# Patient Record
Sex: Female | Born: 1995 | Race: White | Hispanic: No | Marital: Single | Smoking: Former smoker
Health system: Southern US, Community
[De-identification: ages and names within clinical notes are randomized; demographics above are authoritative.]

## PROBLEM LIST (undated history)

## (undated) DIAGNOSIS — N83519 Torsion of ovary and ovarian pedicle, unspecified side: Secondary | ICD-10-CM

---
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
## 2018-01-27 ENCOUNTER — Other Ambulatory Visit: Payer: Self-pay

## 2018-01-27 ENCOUNTER — Encounter (HOSPITAL_BASED_OUTPATIENT_CLINIC_OR_DEPARTMENT_OTHER): Payer: Self-pay | Admitting: *Deleted

## 2018-01-27 ENCOUNTER — Emergency Department (HOSPITAL_BASED_OUTPATIENT_CLINIC_OR_DEPARTMENT_OTHER)
Admission: EM | Admit: 2018-01-27 | Discharge: 2018-01-27 | Disposition: A | Discharge status: Home / Self Care | Payer: Commercial Managed Care - PPO | Attending: Emergency Medicine | Admitting: Emergency Medicine

## 2018-01-27 DIAGNOSIS — M25512 Pain in left shoulder: Secondary | ICD-10-CM | POA: Diagnosis present

## 2018-01-27 DIAGNOSIS — F17210 Nicotine dependence, cigarettes, uncomplicated: Secondary | ICD-10-CM | POA: Insufficient documentation

## 2018-01-27 DIAGNOSIS — Z79899 Other long term (current) drug therapy: Secondary | ICD-10-CM | POA: Diagnosis not present

## 2018-01-27 DIAGNOSIS — J45909 Unspecified asthma, uncomplicated: Secondary | ICD-10-CM | POA: Insufficient documentation

## 2018-01-27 DIAGNOSIS — M62838 Other muscle spasm: Secondary | ICD-10-CM | POA: Insufficient documentation

## 2018-01-27 MED ORDER — IBUPROFEN 800 MG PO TABS: 800.0000 mg | ORAL_TABLET | Freq: Three times a day (TID) | ORAL | 0 refills | Status: AC

## 2018-01-27 MED ORDER — DIAZEPAM 5 MG PO TABS
5.0000 mg | ORAL_TABLET | Freq: Once | ORAL | Status: AC
  Administered 2018-01-27: 5 mg via ORAL
  Filled 2018-01-27: qty 1

## 2018-01-27 MED ORDER — IBUPROFEN 800 MG PO TABS
800.0000 mg | ORAL_TABLET | Freq: Once | ORAL | Status: AC
  Administered 2018-01-27: 800 mg via ORAL
  Filled 2018-01-27: qty 1

## 2018-01-27 NOTE — ED Provider Notes (Signed)
[facility] EMERGENCY DEPARTMENT Provider Note   CSN: [id number] Arrival date & time: 01/27/18  1835     History   Chief Complaint Chief Complaint  Patient presents with  . Shoulder Pain    HPI Annette Middleton is a 22 y.o. female.  The history is provided by the patient.  Illness  This is a new problem. The current episode started more than 2 days ago. The problem occurs daily. The problem has not changed since onset.Pertinent negatives include no chest pain, no abdominal pain, no headaches and no shortness of breath. Associated symptoms comments: Left shoulder pain/left trapezius pain . The symptoms are aggravated by bending (movement). The symptoms are relieved by medications. She has tried a warm compress for the symptoms. The treatment provided mild relief.    Past Medical History:  Diagnosis Date  . Asthma   . Ovarian torsion     Patient Active Problem List   Diagnosis Date Noted  . Right knee pain 06/26/2011    History reviewed. No pertinent surgical history.   OB History   None      Home Medications    Prior to Admission medications   Medication Sig Start Date End Date Taking? Authorizing Provider  levonorgestrel-ethinyl estradiol (AVIANE,ALESSE,LESSINA) 0.1-20 MG-MCG tablet Take 1 tablet by mouth daily.   Yes [provider]  acetaminophen-codeine 120-12 MG/5ML solution Take 10 mLs by mouth every 4 (four) hours as needed for moderate pain. 11/06/15   Lawyer, Cristal Deer, PA-C  albuterol (PROVENTIL) (2.5 MG/3ML) 0.083% nebulizer solution Take 2.5 mg by nebulization every 6 (six) hours as needed. Shortness of breath    [provider]  famotidine (PEPCID) 20 MG tablet Take 1 tablet (20 mg total) by mouth 2 (two) times daily. 03/30/15   Palumbo, April, MD  ibuprofen (ADVIL,MOTRIN) 800 MG tablet Take 1 tablet (800 mg total) by mouth 3 (three) times daily for 30 doses. 01/27/18 02/06/18  Shaneeka Scarboro, DO  nitrofurantoin,  macrocrystal-monohydrate, (MACROBID) 100 MG capsule Take 1 capsule (100 mg total) by mouth 2 (two) times daily. 06/25/13   Teressa Lower, NP  triamcinolone cream (KENALOG) 0.1 % Apply 1 application topically 2 (two) times daily. 11/16/13   Elson Areas, PA-C    Family History Family History  Problem Relation Age of Onset  . Hyperlipidemia Mother   . Hypertension Mother   . Sudden death Neg Hx   . Heart attack Neg Hx   . Diabetes Neg Hx     Social History Social History   Tobacco Use  . Smoking status: Current Every Day Smoker    Packs/day: 0.50  . Smokeless tobacco: Never Used  Substance Use Topics  . Alcohol use: No  . Drug use: Yes    Types: Marijuana    Comment: socially     Allergies   Fish-derived products; Red dye; and Sulfa antibiotics   Review of Systems Review of Systems  Constitutional: Negative for chills and fever.  HENT: Negative for ear pain and sore throat.   Eyes: Negative for pain and visual disturbance.  Respiratory: Negative for cough and shortness of breath.   Cardiovascular: Negative for chest pain and palpitations.  Gastrointestinal: Negative for abdominal pain and vomiting.  Genitourinary: Negative for dysuria and hematuria.  Musculoskeletal: Positive for arthralgias and myalgias. Negative for back pain, gait problem, joint swelling, neck pain and neck stiffness.  Skin: Negative for color change and rash.  Neurological: Negative for dizziness, tremors, seizures, syncope, facial asymmetry, speech difficulty, weakness,  light-headedness, numbness and headaches.  All other systems reviewed and are negative.    Physical Exam Updated Vital Signs BP 137/86 (BP Location: Right Arm)   Pulse 95   Temp 98.8 F (37.1 C) (Oral)   Resp 18   Ht 5' 2.5" (1.588 m)   Wt 64.4 kg   LMP 01/06/2018   SpO2 100%   BMI 25.56 kg/m   Physical Exam  Constitutional: She is oriented to person, place, and time. She appears well-developed and well-nourished.  No distress.  HENT:  Head: Normocephalic and atraumatic.  Eyes: Pupils are equal, round, and reactive to light. Conjunctivae and EOM are normal.  Neck: Normal range of motion. Neck supple.  Cardiovascular: Normal rate, regular rhythm, normal heart sounds and intact distal pulses.  No murmur heard. Pulmonary/Chest: Effort normal and breath sounds normal. No respiratory distress.  Abdominal: Soft. There is no tenderness.  Musculoskeletal: Normal range of motion. She exhibits tenderness (TTP to left trapezius/lats). She exhibits no edema or deformity.  No midline spinal pain   Neurological: She is alert and oriented to person, place, and time. No cranial nerve deficit or sensory deficit. She exhibits normal muscle tone. Coordination normal.  5+/5 strength in UEs, normal sensation in UE  Skin: Skin is warm and dry.  Psychiatric: She has a normal mood and affect.  Nursing note and vitals reviewed.    ED Treatments / Results  Labs (all labs ordered are listed, but only abnormal results are displayed) Labs Reviewed - No data to display  EKG None  Radiology No results found.  Procedures Procedures (including critical care time)  Medications Ordered in ED Medications  ibuprofen (ADVIL,MOTRIN) tablet 800 mg (800 mg Oral Given 01/27/18 1949)  diazepam (VALIUM) tablet 5 mg (5 mg Oral Given 01/27/18 1949)     Initial Impression / Assessment and Plan / ED Course  I have reviewed the triage vital signs and the nursing notes.  Pertinent labs & imaging results that were available during my care of the patient were reviewed by me and considered in my medical decision making (see chart for details).     Annette Middleton is a 22 year old female with no significant medical history who presents to the ED with left shoulder pain.  Patient with normal vitals.  No fever.  Patient works at the manual labor job.  States that she has had pain in the left shoulder and upper back for the last several  days to weeks.  Pain is worse with lifting heavy boxes at work.  She has used nightly muscle relaxant without much relief.  She has not tried any anti-inflammatories.  She denies any specific trauma.  Patient is tender in the left trapezius and left latissimus area on exam.  Consistent with a muscle spasm.  Patient is neurologically intact.  No concern for spinal process/radiculopathy or rotator tear.  Patient given Valium and Motrin while in the ED.  Recommend continued use of Motrin and muscle relaxant at home.  Written for light duty.  Given return precautions and discharged from ED in good condition.  This chart was dictated using voice recognition software.  Despite best efforts to proofread,  errors can occur which can change the documentation meaning.   Final Clinical Impressions(s) / ED Diagnoses   Final diagnoses:  Muscle spasm    ED Discharge Orders         Ordered    ibuprofen (ADVIL,MOTRIN) 800 MG tablet  3 times daily     01/27/18  [address]           [place], [place] 01/27/18 2157

## 2018-01-27 NOTE — ED Triage Notes (Signed)
Left shoulder pain for a week. No known injury. Limited ROM.

## 2018-03-10 ENCOUNTER — Emergency Department (HOSPITAL_BASED_OUTPATIENT_CLINIC_OR_DEPARTMENT_OTHER)
Admission: EM | Admit: 2018-03-10 | Discharge: 2018-03-11 | Disposition: A | Discharge status: Home / Self Care | Payer: Commercial Managed Care - PPO | Attending: Emergency Medicine | Admitting: Emergency Medicine

## 2018-03-10 ENCOUNTER — Emergency Department (HOSPITAL_BASED_OUTPATIENT_CLINIC_OR_DEPARTMENT_OTHER): Payer: Commercial Managed Care - PPO

## 2018-03-10 ENCOUNTER — Encounter (HOSPITAL_BASED_OUTPATIENT_CLINIC_OR_DEPARTMENT_OTHER): Payer: Self-pay | Admitting: *Deleted

## 2018-03-10 ENCOUNTER — Other Ambulatory Visit: Payer: Self-pay

## 2018-03-10 DIAGNOSIS — M25511 Pain in right shoulder: Secondary | ICD-10-CM | POA: Diagnosis present

## 2018-03-10 DIAGNOSIS — F17200 Nicotine dependence, unspecified, uncomplicated: Secondary | ICD-10-CM | POA: Insufficient documentation

## 2018-03-10 MED ORDER — IBUPROFEN 400 MG PO TABS
400.0000 mg | ORAL_TABLET | Freq: Once | ORAL | Status: AC | PRN
  Administered 2018-03-10: 400 mg via ORAL
  Filled 2018-03-10: qty 1

## 2018-03-10 NOTE — ED Triage Notes (Signed)
Right shoulder pain x 2 days. She hit her shoulder on a wall.

## 2018-03-11 MED ORDER — NAPROXEN 500 MG PO TABS: 500.0000 mg | ORAL_TABLET | Freq: Two times a day (BID) | ORAL | 0 refills | Status: DC

## 2018-03-11 MED ORDER — TRAMADOL HCL 50 MG PO TABS: 50.0000 mg | ORAL_TABLET | Freq: Four times a day (QID) | ORAL | 0 refills | Status: AC | PRN

## 2018-03-11 NOTE — ED Provider Notes (Signed)
[facility] EMERGENCY DEPARTMENT Provider Note   CSN: [id number] Arrival date & time: 03/10/18  1843     History   Chief Complaint Chief Complaint  Patient presents with  . Shoulder Pain    HPI Annette Middleton is a 22 y.o. female.  Patient is a 22 year old female with no significant past medical history.  She presents with complaints of right shoulder pain.  This began yesterday in the absence of any injury or trauma.  She describes pain in the back of her shoulder that radiates into the lateral aspect of her shoulder.  It is worse with certain positions and movements.  She denies any numbness, tingling, or weakness.  The history is provided by the patient.  Shoulder Pain   This is a new problem. The current episode started yesterday. The problem occurs constantly. The problem has not changed since onset.The pain is severe. Pertinent negatives include no numbness and full range of motion. The symptoms are aggravated by activity. She has tried nothing for the symptoms.    Past Medical History:  Diagnosis Date  . Asthma   . Ovarian torsion     Patient Active Problem List   Diagnosis Date Noted  . Right knee pain 06/26/2011    History reviewed. No pertinent surgical history.   OB History   No obstetric history on file.      Home Medications    Prior to Admission medications   Medication Sig Start Date End Date Taking? Authorizing Provider  levonorgestrel-ethinyl estradiol (AVIANE,ALESSE,LESSINA) 0.1-20 MG-MCG tablet Take 1 tablet by mouth daily.   Yes [provider]  acetaminophen-codeine 120-12 MG/5ML solution Take 10 mLs by mouth every 4 (four) hours as needed for moderate pain. 11/06/15   Lawyer, Cristal Deerhristopher, PA-C  albuterol (PROVENTIL) (2.5 MG/3ML) 0.083% nebulizer solution Take 2.5 mg by nebulization every 6 (six) hours as needed. Shortness of breath    [provider]  famotidine (PEPCID) 20 MG tablet Take 1 tablet (20 mg total) by  mouth 2 (two) times daily. 03/30/15   Palumbo, April, MD  nitrofurantoin, macrocrystal-monohydrate, (MACROBID) 100 MG capsule Take 1 capsule (100 mg total) by mouth 2 (two) times daily. 06/25/13   Teressa LowerPickering, Vrinda, NP  triamcinolone cream (KENALOG) 0.1 % Apply 1 application topically 2 (two) times daily. 11/16/13   Elson AreasSofia, Leslie K, PA-C    Family History Family History  Problem Relation Age of Onset  . Hyperlipidemia Mother   . Hypertension Mother   . Sudden death Neg Hx   . Heart attack Neg Hx   . Diabetes Neg Hx     Social History Social History   Tobacco Use  . Smoking status: Current Every Day Smoker    Packs/day: 0.50  . Smokeless tobacco: Never Used  Substance Use Topics  . Alcohol use: No  . Drug use: Yes    Types: Marijuana    Comment: socially     Allergies   Fish-derived products; Red dye; and Sulfa antibiotics   Review of Systems Review of Systems  Neurological: Negative for numbness.  All other systems reviewed and are negative.    Physical Exam Updated Vital Signs BP 125/78 (BP Location: Left Arm)   Pulse 74   Temp 98.8 F (37.1 C) (Oral)   Resp 18   Ht 5' 2.5" (1.588 m)   Wt 66.2 kg   LMP 01/08/2018 Comment: states she has abnormal periods  SpO2 100%   BMI 26.28 kg/m   Physical Exam Vitals signs and  nursing note reviewed.  Constitutional:      Appearance: Normal appearance.  HENT:     Head: Normocephalic and atraumatic.  Pulmonary:     Effort: Pulmonary effort is normal.  Musculoskeletal:     Comments: The right shoulder appears grossly normal.  There is no swelling or deformity.  Ulnar and radial pulses are easily palpable and motor and sensation are intact throughout the entire hand.  She does have pain with range of motion.  Skin:    General: Skin is warm.  Neurological:     Mental Status: She is alert.      ED Treatments / Results  Labs (all labs ordered are listed, but only abnormal results are displayed) Labs Reviewed - No  data to display  EKG None  Radiology Dg Shoulder Right  Result Date: 03/10/2018 CLINICAL DATA:  Ran into wall, anterior pain EXAM: RIGHT SHOULDER - 2+ VIEW COMPARISON:  None. FINDINGS: Superior elevation of the distal end of the right clavicle with respect to acromion without joint widening. No fracture or malalignment at the glenohumeral interval. The right lung apex is clear. IMPRESSION: Superior migration of the distal end of the right clavicle with respect to the acromion, suspicious for [facility] joint injury. Electronically Signed   By: Jasmine PangKim  Fujinaga M.D.   On: 03/10/2018 19:31    Procedures Procedures (including critical care time)  Medications Ordered in ED Medications  ibuprofen (ADVIL,MOTRIN) tablet 400 mg (400 mg Oral Given 03/10/18 2252)     Initial Impression / Assessment and Plan / ED Course  I have reviewed the triage vital signs and the nursing notes.  Pertinent labs & imaging results that were available during my care of the patient were reviewed by me and considered in my medical decision making (see chart for details).  Patient with musculoskeletal right shoulder pain, possibly radicular in nature.  She will be placed in an arm sling, treated with anti-inflammatories and tramadol, and is to follow-up as needed if not improving.  Final Clinical Impressions(s) / ED Diagnoses   Final diagnoses:  None    ED Discharge Orders    None       Geoffery Lyonselo, Gennell How, MD 03/11/18 [phone]

## 2018-03-11 NOTE — Discharge Instructions (Addendum)
Naproxen as prescribed.  Tramadol as prescribed as needed for pain not relieved with naproxen.  Wear arm sling for the next several days, then slowly reintroduce activity as tolerated.  Follow-up with your primary doctor if your symptoms or not improving in the next 2 weeks.

## 2018-05-15 ENCOUNTER — Encounter (HOSPITAL_BASED_OUTPATIENT_CLINIC_OR_DEPARTMENT_OTHER): Payer: Self-pay | Admitting: Emergency Medicine

## 2018-05-15 ENCOUNTER — Other Ambulatory Visit: Payer: Self-pay

## 2018-05-15 ENCOUNTER — Emergency Department (HOSPITAL_BASED_OUTPATIENT_CLINIC_OR_DEPARTMENT_OTHER)
Admission: EM | Admit: 2018-05-15 | Discharge: 2018-05-15 | Disposition: A | Discharge status: Home / Self Care | Payer: Commercial Managed Care - PPO | Attending: Emergency Medicine | Admitting: Emergency Medicine

## 2018-05-15 DIAGNOSIS — F17200 Nicotine dependence, unspecified, uncomplicated: Secondary | ICD-10-CM | POA: Insufficient documentation

## 2018-05-15 DIAGNOSIS — J45909 Unspecified asthma, uncomplicated: Secondary | ICD-10-CM | POA: Insufficient documentation

## 2018-05-15 DIAGNOSIS — Z79899 Other long term (current) drug therapy: Secondary | ICD-10-CM | POA: Insufficient documentation

## 2018-05-15 DIAGNOSIS — R599 Enlarged lymph nodes, unspecified: Secondary | ICD-10-CM | POA: Insufficient documentation

## 2018-05-15 NOTE — ED Triage Notes (Signed)
Pt having a hard knot in between her left side lower abd and groin that is painful to touch.

## 2018-05-15 NOTE — ED Provider Notes (Signed)
[facility] EMERGENCY DEPARTMENT Provider Note   CSN: [id number] Arrival date & time: 05/15/18  1929    History   Chief Complaint Chief Complaint  Patient presents with  . Cyst    HPI Annette Middleton is a 23 y.o. female presenting to the emergency department with complaint of a "knot" in her left groin since Tuesday of last week.  She states symptoms come and go, however worsened yesterday evening her dog stepped on that area.  States pain is stabbing, worse with walking, moving her left leg, prolonged standing.  Has had associated low back ache.  Of note, she is currently being treated with metronidazole for bacterial vaginosis.  No interventions tried prior to arrival.  Denies associated diarrhea, constipation, urinary symptoms, fever.  LMP 04/28/2018.  No history of abdominal surgeries.  No recent heavy lifting.     The history is provided by the patient.    Past Medical History:  Diagnosis Date  . Asthma   . Ovarian torsion     Patient Active Problem List   Diagnosis Date Noted  . Right knee pain 06/26/2011    History reviewed. No pertinent surgical history.   OB History   No obstetric history on file.      Home Medications    Prior to Admission medications   Medication Sig Start Date End Date Taking? Authorizing Provider  acetaminophen-codeine 120-12 MG/5ML solution Take 10 mLs by mouth every 4 (four) hours as needed for moderate pain. 11/06/15   Lawyer, Cristal Deer, PA-C  albuterol (PROVENTIL) (2.5 MG/3ML) 0.083% nebulizer solution Take 2.5 mg by nebulization every 6 (six) hours as needed. Shortness of breath    [provider]  famotidine (PEPCID) 20 MG tablet Take 1 tablet (20 mg total) by mouth 2 (two) times daily. 03/30/15   Palumbo, April, MD  levonorgestrel-ethinyl estradiol (AVIANE,ALESSE,LESSINA) 0.1-20 MG-MCG tablet Take 1 tablet by mouth daily.    [provider]  naproxen (NAPROSYN) 500 MG tablet Take 1 tablet (500 mg total)  by mouth 2 (two) times daily. 03/11/18   Geoffery Lyons, MD  nitrofurantoin, macrocrystal-monohydrate, (MACROBID) 100 MG capsule Take 1 capsule (100 mg total) by mouth 2 (two) times daily. 06/25/13   Teressa Lower, NP  traMADol (ULTRAM) 50 MG tablet Take 1 tablet (50 mg total) by mouth every 6 (six) hours as needed. 03/11/18   Geoffery Lyons, MD  triamcinolone cream (KENALOG) 0.1 % Apply 1 application topically 2 (two) times daily. 11/16/13   Elson Areas, PA-C    Family History Family History  Problem Relation Age of Onset  . Hyperlipidemia Mother   . Hypertension Mother   . Sudden death Neg Hx   . Heart attack Neg Hx   . Diabetes Neg Hx     Social History Social History   Tobacco Use  . Smoking status: Current Every Day Smoker    Packs/day: 0.50  . Smokeless tobacco: Never Used  Substance Use Topics  . Alcohol use: No  . Drug use: Yes    Types: Marijuana    Comment: socially     Allergies   Fish-derived products; Red dye; and Sulfa antibiotics   Review of Systems Review of Systems  Constitutional: Negative for fever.  Gastrointestinal: Negative for abdominal pain, constipation, diarrhea, nausea and vomiting.       Groin pain  Genitourinary: Negative for dysuria and frequency.  Musculoskeletal: Positive for back pain.  All other systems reviewed and are negative.    Physical Exam Updated  Vital Signs BP 124/76 (BP Location: Left Arm)   Pulse 97   Temp 98.9 F (37.2 C) (Oral)   Resp 16   Ht 5' 2.5" (1.588 m)   Wt 63.5 kg   LMP 04/28/2018   SpO2 100%   BMI 25.20 kg/m   Physical Exam Vitals signs and nursing note reviewed.  Constitutional:      Appearance: She is well-developed.  HENT:     Head: Normocephalic and atraumatic.  Eyes:     Conjunctiva/sclera: Conjunctivae normal.  Cardiovascular:     Rate and Rhythm: Normal rate and regular rhythm.  Pulmonary:     Effort: Pulmonary effort is normal.     Breath sounds: Normal breath sounds.    Abdominal:     General: Bowel sounds are normal. There is no distension.     Palpations: Abdomen is soft.     Tenderness: There is no guarding or rebound.       Comments: There is a grape-sized tender firm mobile mass to left inguinal region. No fluctuance. No skin changes  Skin:    General: Skin is warm.  Neurological:     Mental Status: She is alert.  Psychiatric:        Behavior: Behavior normal.      ED Treatments / Results  Labs (all labs ordered are listed, but only abnormal results are displayed) Labs Reviewed - No data to display  EKG None  Radiology No results found.  Procedures Procedures (including critical care time)  Medications Ordered in ED Medications - No data to display   Initial Impression / Assessment and Plan / ED Course  I have reviewed the triage vital signs and the nursing notes.  Pertinent labs & imaging results that were available during my care of the patient were reviewed by me and considered in my medical decision making (see chart for details).        Pt with recent diagnosis of BV, currently undergoing treatment, presenting with some firm mobile mass to left inguinal region. Symptoms began in relation to the BV symptoms. Afebrile, normal VS. Abdomen is soft and nontender. Bedside ultrasound without evidence of fluid collection. Mass appears to be consistent with lymph node on color. Pt discussed with and evaluated by Dr. Lockie Mola. Felt to be likely enlarged lymph node, less likely hernia. No other abdominal symptoms. Will discharge with symptomatic management, outpatient follow up and strict return precautions. Pt agreeable to plan and safe for discharge.  Discussed results, findings, treatment and follow up. Patient advised of return precautions. Patient verbalized understanding and agreed with plan.  Final Clinical Impressions(s) / ED Diagnoses   Final diagnoses:  Enlarged lymph node    ED Discharge Orders    None        , [place] N, PA-C 05/15/18 2057    Virgina Norfolk, DO 05/15/18 2125

## 2018-05-15 NOTE — Discharge Instructions (Addendum)
Treat your symptoms with ibuprofen and tylenol as needed for pain. Complete your antibiotics as prescribed.  Follow-up with your gynecologist regarding your visit today.  If you develop fever, nausea/vomiting, constipation, or new or concerning symptoms, you can report to the emergency department.

## 2018-05-15 NOTE — ED Provider Notes (Signed)
Medical screening examination/treatment/procedure(s) were conducted as a shared visit with non-physician practitioner(s) and myself.  I personally evaluated the patient during the encounter. Briefly, the patient is a 23 y.o. female with history of asthma who presents to the ED with left inguinal pain.  Patient with normal vitals.  No fever.  Pain for the last 2 days.  Patient recently diagnosed with bacterial vaginosis and has been on Flagyl.  Denies any further vaginal discharge, vaginal bleeding.  Is on birth control.  No concern for pregnancy.  Patient denies any abdominal pain, no nausea, no vomiting.  Patient has normal bowel movements.  Patient has tenderness that cystic-like structure along the left inguinal ligament that is likely consistent with lymphadenopathy.  Likely from bacterial vaginosis. No concern for STD. Does not appear consistent with hernia.  She has no abdominal tenderness on exam.No consistent with torsion.  Overall this is likely a reactive lymph node from bacterial vaginosis.  She has been on Flagyl for the last 2 days.  Recommend that she finish off course of Flagyl.  Given strict return precautions if she develops any fever, nausea, vomiting, issues with going to the bathroom she should come back for evaluation.  Recommend Tylenol Motrin for pain.  Discharged from ED in good condition.  This chart was dictated using voice recognition software.  Despite best efforts to proofread,  errors can occur which can change the documentation meaning.     EKG Interpretation None           Virgina Norfolk, DO 05/15/18 2050

## 2018-05-17 ENCOUNTER — Other Ambulatory Visit: Payer: Self-pay

## 2018-05-17 ENCOUNTER — Encounter (HOSPITAL_BASED_OUTPATIENT_CLINIC_OR_DEPARTMENT_OTHER): Payer: Self-pay | Admitting: Emergency Medicine

## 2018-05-17 ENCOUNTER — Emergency Department (HOSPITAL_BASED_OUTPATIENT_CLINIC_OR_DEPARTMENT_OTHER)
Admission: EM | Admit: 2018-05-17 | Discharge: 2018-05-17 | Disposition: A | Discharge status: Home / Self Care | Payer: Medicaid Other | Attending: Emergency Medicine | Admitting: Emergency Medicine

## 2018-05-17 DIAGNOSIS — B3731 Acute candidiasis of vulva and vagina: Secondary | ICD-10-CM

## 2018-05-17 DIAGNOSIS — Z79899 Other long term (current) drug therapy: Secondary | ICD-10-CM | POA: Insufficient documentation

## 2018-05-17 DIAGNOSIS — J45909 Unspecified asthma, uncomplicated: Secondary | ICD-10-CM | POA: Insufficient documentation

## 2018-05-17 DIAGNOSIS — F17200 Nicotine dependence, unspecified, uncomplicated: Secondary | ICD-10-CM | POA: Insufficient documentation

## 2018-05-17 DIAGNOSIS — B373 Candidiasis of vulva and vagina: Secondary | ICD-10-CM | POA: Insufficient documentation

## 2018-05-17 DIAGNOSIS — N3000 Acute cystitis without hematuria: Secondary | ICD-10-CM | POA: Insufficient documentation

## 2018-05-17 LAB — URINALYSIS, ROUTINE W REFLEX MICROSCOPIC
Bilirubin Urine: NEGATIVE
GLUCOSE, UA: NEGATIVE mg/dL
Ketones, ur: NEGATIVE mg/dL
Nitrite: NEGATIVE
PROTEIN: NEGATIVE mg/dL
Specific Gravity, Urine: 1.025 (ref 1.005–1.030)
pH: 5.5 (ref 5.0–8.0)

## 2018-05-17 LAB — WET PREP, GENITAL
Clue Cells Wet Prep HPF POC: NONE SEEN
SPERM: NONE SEEN
Trich, Wet Prep: NONE SEEN
Yeast Wet Prep HPF POC: NONE SEEN

## 2018-05-17 LAB — URINALYSIS, MICROSCOPIC (REFLEX)

## 2018-05-17 LAB — PREGNANCY, URINE: Preg Test, Ur: NEGATIVE

## 2018-05-17 MED ORDER — CEPHALEXIN 250 MG PO CAPS
500.0000 mg | ORAL_CAPSULE | Freq: Once | ORAL | Status: AC
  Administered 2018-05-17: 500 mg via ORAL
  Filled 2018-05-17: qty 2

## 2018-05-17 MED ORDER — NYSTATIN-TRIAMCINOLONE 100000-0.1 UNIT/GM-% EX CREA: TOPICAL_CREAM | CUTANEOUS | 0 refills | Status: DC

## 2018-05-17 MED ORDER — PROBIOTIC PO CAPS: 1.0000 | ORAL_CAPSULE | Freq: Every day | ORAL | 0 refills | Status: AC

## 2018-05-17 MED ORDER — FLUCONAZOLE 150 MG PO TABS: 150.0000 mg | ORAL_TABLET | Freq: Once | ORAL | 0 refills | Status: AC

## 2018-05-17 MED ORDER — CEPHALEXIN 500 MG PO CAPS: 500.0000 mg | ORAL_CAPSULE | Freq: Three times a day (TID) | ORAL | 0 refills | Status: DC

## 2018-05-17 MED ORDER — LIDOCAINE 5 % EX OINT: 1.0000 "application " | TOPICAL_OINTMENT | CUTANEOUS | 0 refills | Status: DC | PRN

## 2018-05-17 MED ORDER — LIDOCAINE 4 % EX CREA
TOPICAL_CREAM | Freq: Once | CUTANEOUS | Status: AC
  Administered 2018-05-17: 1 via TOPICAL
  Filled 2018-05-17: qty 5

## 2018-05-17 MED ORDER — FLUCONAZOLE 50 MG PO TABS
150.0000 mg | ORAL_TABLET | Freq: Once | ORAL | Status: AC
  Administered 2018-05-17: 150 mg via ORAL
  Filled 2018-05-17: qty 1

## 2018-05-17 MED ORDER — ACETAMINOPHEN 500 MG PO TABS
1000.0000 mg | ORAL_TABLET | Freq: Once | ORAL | Status: AC
  Administered 2018-05-17: 1000 mg via ORAL
  Filled 2018-05-17: qty 2

## 2018-05-17 NOTE — ED Provider Notes (Signed)
TIME SEEN: 2:10 AM  CHIEF COMPLAINT: Vaginal irritation  HPI: Patient is a 23 year old female with history of asthma, ovarian torsion who presents to the emergency department with vaginal irritation for the past day.  Was seen here 2 days ago for inguinal lymphadenopathy on the left side.  States afterwards she started developing redness and swelling to her labia with itching.  She states she has been trying not to scratch but has been so uncomfortable and itching in this area.  She states she has had a thick white discharge.  Is currently on metronidazole for bacterial vaginosis.  Has previously had chlamydia.  Last menstrual cycle was February 17.  No history of previous pregnancy.  States she has had some nausea secondary to the pain.  No vomiting or diarrhea.  No abdominal pain.  No fever.  States it is painful when the urine touches the outside of her labia but she has not having dysuria.  Mother has been using topical nystatin powder and cream without relief.  ROS: See HPI Constitutional: no fever  Eyes: no drainage  ENT: no runny nose   Cardiovascular:  no chest pain  Resp: no SOB  GI: no vomiting GU: no dysuria Integumentary: no rash  Allergy: no hives  Musculoskeletal: no leg swelling  Neurological: no slurred speech ROS otherwise negative  PAST MEDICAL HISTORY/PAST SURGICAL HISTORY:  Past Medical History:  Diagnosis Date  . Asthma   . Ovarian torsion     MEDICATIONS:  Prior to Admission medications   Medication Sig Start Date End Date Taking? Authorizing Provider  acetaminophen-codeine 120-12 MG/5ML solution Take 10 mLs by mouth every 4 (four) hours as needed for moderate pain. 11/06/15   Lawyer, Cristal Deer, PA-C  albuterol (PROVENTIL) (2.5 MG/3ML) 0.083% nebulizer solution Take 2.5 mg by nebulization every 6 (six) hours as needed. Shortness of breath    [provider]  famotidine (PEPCID) 20 MG tablet Take 1 tablet (20 mg total) by mouth 2 (two) times daily.  03/30/15   Palumbo, April, MD  levonorgestrel-ethinyl estradiol (AVIANE,ALESSE,LESSINA) 0.1-20 MG-MCG tablet Take 1 tablet by mouth daily.    [provider]  naproxen (NAPROSYN) 500 MG tablet Take 1 tablet (500 mg total) by mouth 2 (two) times daily. 03/11/18   Geoffery Lyons, MD  nitrofurantoin, macrocrystal-monohydrate, (MACROBID) 100 MG capsule Take 1 capsule (100 mg total) by mouth 2 (two) times daily. 06/25/13   Teressa Lower, NP  traMADol (ULTRAM) 50 MG tablet Take 1 tablet (50 mg total) by mouth every 6 (six) hours as needed. 03/11/18   Geoffery Lyons, MD  triamcinolone cream (KENALOG) 0.1 % Apply 1 application topically 2 (two) times daily. 11/16/13   Elson Areas, PA-C    ALLERGIES:  Allergies  Allergen Reactions  . Fish-Derived Products Swelling  . Red Dye Hives  . Sulfa Antibiotics     SOCIAL HISTORY:  Social History   Tobacco Use  . Smoking status: Current Every Day Smoker    Packs/day: 0.50  . Smokeless tobacco: Never Used  Substance Use Topics  . Alcohol use: No    FAMILY HISTORY: Family History  Problem Relation Age of Onset  . Hyperlipidemia Mother   . Hypertension Mother   . Sudden death Neg Hx   . Heart attack Neg Hx   . Diabetes Neg Hx     EXAM: BP 117/79 (BP Location: Left Arm)   Pulse (!) 104   Temp 97.7 F (36.5 C) (Oral)   Resp 16   Ht  5' 2.5" (1.588 m)   Wt 63.5 kg   LMP 04/28/2018   SpO2 100%   BMI 25.20 kg/m  CONSTITUTIONAL: Alert and oriented and responds appropriately to questions. Well-appearing; well-nourished HEAD: Normocephalic EYES: Conjunctivae clear, pupils appear equal, EOMI ENT: normal nose; moist mucous membranes NECK: Supple, no meningismus, no nuchal rigidity, no LAD  CARD: RRR; S1 and S2 appreciated; no murmurs, no clicks, no rubs, no gallops RESP: Normal chest excursion without splinting or tachypnea; breath sounds clear and equal bilaterally; no wheezes, no rhonchi, no rales, no hypoxia or respiratory  distress, speaking full sentences ABD/GI: Normal bowel sounds; non-distended; soft, non-tender, no rebound, no guarding, no peritoneal signs, no hepatosplenomegaly GU: Patient's external labia majora are inflamed, red with excoriations.  She has thick white appearing substance on her labia which may be nystatin.  No vaginal discharge.  No adnexal tenderness, mass or fullness, no cervical motion tenderness. Cervix is not appear friable.  Cervix is closed.  Chaperone present for exam. BACK:  The back appears normal and is non-tender to palpation, there is no CVA tenderness EXT: Normal ROM in all joints; non-tender to palpation; no edema; normal capillary refill; no cyanosis, no calf tenderness or swelling    SKIN: Normal color for age and race; warm; no rash NEURO: Moves all extremities equally PSYCH: The patient's mood and manner are appropriate. Grooming and personal hygiene are appropriate.  MEDICAL DECISION MAKING: Patient here with vaginitis.  I suspect that this is from yeast.  Her labia are inflamed, red and excoriated but no sign of superimposed bacterial infection.  No vesicular lesions.  She has no significant vaginal discharge on speculum exam and her wet prep shows many white blood cells but otherwise unremarkable.  Her pregnancy test is negative.  Bimanual exam unremarkable with no cervical motion tenderness, adnexal tenderness or mass.  Doubt TOA, torsion, PID.  Recommended they continue alternating Tylenol and Motrin for pain.  Will discharge with Lidoderm cream so that she can place it on her labia to help with discomfort.  Recommend sitz bath's.  Recommend she stop shaving this area.  Given Diflucan here in the emergency department and will discharge with prescription for the same to take in 3 days if symptoms not improving.  Urine appears infected versus dirty catch.  She now endorses some urinary frequency and urgency.  We will send urine culture and also treat with Keflex.  Have  recommended that she take probiotics given she will be on Keflex and finishing her prescription of Flagyl.  Discussed return precautions with patient and mother.  Will give outpatient OB/GYN follow-up as needed.   At this time, I do not feel there is any life-threatening condition present. I have reviewed and discussed all results (EKG, imaging, lab, urine as appropriate) and exam findings with patient/family. I have reviewed nursing notes and appropriate previous records.  I feel the patient is safe to be discharged home without further emergent workup and can continue workup as an outpatient as needed. Discussed usual and customary return precautions. Patient/family verbalize understanding and are comfortable with this plan.  Outpatient follow-up has been provided as needed. All questions have been answered.      Ataya Murdy, Layla Maw, DO 05/17/18 [phone]

## 2018-05-17 NOTE — ED Triage Notes (Signed)
Pt c/o vaginal pain  

## 2018-05-17 NOTE — Discharge Instructions (Addendum)
You may alternate Tylenol 1000 mg every 6 hours as needed for pain and Ibuprofen 800 mg every 8 hours as needed for pain.  Please take Ibuprofen with food.  I recommend you finish your prescription of metronidazole.  Steps to find a Primary Care Provider (PCP):  Call [phone] or [phone] to access "[place] Find a Doctor Service."  2.  You may also go on the [facility] website at [email or web address]  3.  [facility] also frequently accepts new patients.  [facility]  [address] [place] [address] [phone]  4.  There are also multiple [facility], [facility] and [facility] practices throughout [place] that are frequently accepting new patients. You may find a clinic that is close to your home and contact them.  [facility] [email or web address] [phone]  [facility] [email or web address]  [facility] [email or web address] [phone]  [facility] [email or web address] [phone]  5.  Local Health Departments also can provide primary care services.  [facility]  [address] [place] [address] [phone]  [facility] Department [address] [place] [address] [phone]  [facility] [address]  [place] [address] [phone]    [facility]:  Center for [employer] at [facility] [address] [place], [place] [phone]  Center for [facility] at [facility] [address] [place], [place] [phone]  [facility] [address] [place]  [address] [place], [place] [phone]   [facility] [address] [place] [address] [place], [place] [phone]  [facility] [address] [place], [place] [phone]   [facility] [address] [place] [address] [place], [place] [phone]   [facility] [address] [place], [place] [phone]   [facility] [address] [place], [place] [phone]   [facility] [address] [place], [place] [phone]

## 2018-05-18 LAB — URINE CULTURE

## 2018-05-19 ENCOUNTER — Telehealth: Payer: Self-pay | Admitting: Emergency Medicine

## 2018-05-19 LAB — GC/CHLAMYDIA PROBE AMP (~~LOC~~) NOT AT ARMC
CHLAMYDIA, DNA PROBE: NEGATIVE
NEISSERIA GONORRHEA: NEGATIVE

## 2018-05-19 NOTE — Telephone Encounter (Signed)
Post ED Visit - Positive Culture Follow-up  Culture report reviewed by antimicrobial stewardship pharmacist: [facility] []  Enzo Bi, Pharm.D. []  Celedonio Miyamoto, Pharm.D., BCPS AQ-ID []  Garvin Fila, Pharm.D., BCPS []  Georgina Pillion, Pharm.D., BCPS []  [place], [place].D., BCPS, AAHIVP []  Estella Husk, Pharm.D., BCPS, AAHIVP []  Lysle Pearl, PharmD, BCPS []  Phillips Climes, PharmD, BCPS []  Agapito Games, PharmD, BCPS []  Verlan Friends, PharmD []  Mervyn Gay, PharmD, BCPS []  Vinnie Level, PharmD Wendelyn Breslow PharmD  [facility] []  Len Childs, PharmD []  Greer Pickerel, PharmD []  Adalberto Cole, PharmD []  Perlie Gold, Rph []  Lonell Face) Jean Rosenthal, PharmD []  Earl Many, PharmD []  Junita Push, PharmD []  Dorna Leitz, PharmD []  Terrilee Files, PharmD []  Lynann Beaver, PharmD []  Keturah Barre, PharmD []  Loralee Pacas, PharmD []  Bernadene Person, PharmD   Positive urine culture Treated with fluconazole and cephalexin, organism sensitive to the same and no further patient follow-up is required at this time.  Berle Mull 05/19/2018, 1:52 PM

## 2018-08-18 ENCOUNTER — Emergency Department (HOSPITAL_BASED_OUTPATIENT_CLINIC_OR_DEPARTMENT_OTHER): Payer: Medicaid Other

## 2018-08-18 ENCOUNTER — Encounter (HOSPITAL_BASED_OUTPATIENT_CLINIC_OR_DEPARTMENT_OTHER): Payer: Self-pay

## 2018-08-18 ENCOUNTER — Other Ambulatory Visit: Payer: Self-pay

## 2018-08-18 ENCOUNTER — Emergency Department (HOSPITAL_BASED_OUTPATIENT_CLINIC_OR_DEPARTMENT_OTHER)
Admission: EM | Admit: 2018-08-18 | Discharge: 2018-08-18 | Disposition: A | Discharge status: Home / Self Care | Payer: Medicaid Other | Attending: Emergency Medicine | Admitting: Emergency Medicine

## 2018-08-18 DIAGNOSIS — Y999 Unspecified external cause status: Secondary | ICD-10-CM | POA: Insufficient documentation

## 2018-08-18 DIAGNOSIS — S93401A Sprain of unspecified ligament of right ankle, initial encounter: Secondary | ICD-10-CM

## 2018-08-18 DIAGNOSIS — Y9289 Other specified places as the place of occurrence of the external cause: Secondary | ICD-10-CM | POA: Insufficient documentation

## 2018-08-18 DIAGNOSIS — Z79899 Other long term (current) drug therapy: Secondary | ICD-10-CM | POA: Insufficient documentation

## 2018-08-18 DIAGNOSIS — Y9301 Activity, walking, marching and hiking: Secondary | ICD-10-CM | POA: Insufficient documentation

## 2018-08-18 DIAGNOSIS — W010XXA Fall on same level from slipping, tripping and stumbling without subsequent striking against object, initial encounter: Secondary | ICD-10-CM | POA: Insufficient documentation

## 2018-08-18 DIAGNOSIS — T1490XA Injury, unspecified, initial encounter: Secondary | ICD-10-CM

## 2018-08-18 DIAGNOSIS — J45909 Unspecified asthma, uncomplicated: Secondary | ICD-10-CM | POA: Insufficient documentation

## 2018-08-18 DIAGNOSIS — Z87891 Personal history of nicotine dependence: Secondary | ICD-10-CM | POA: Insufficient documentation

## 2018-08-18 NOTE — ED Triage Notes (Signed)
Pt states she twisted/injured right foot today-NAD-to triage in w/c

## 2018-08-18 NOTE — ED Provider Notes (Signed)
[facility] EMERGENCY DEPARTMENT Provider Note   CSN: [id number] Arrival date & time: 08/18/18  1841    History   Chief Complaint Chief Complaint  Patient presents with  . Foot Injury    HPI Annette Middleton is a 23 y.o. female.     Patient is a 23 year old female with past medical history of asthma presenting to the emergency department for right ankle pain.  Patient reports that this morning she was walking out the door when she tripped and fell backwards.  Reports that she landed on her right foot and her ankle inverted.  Reports she has been able to bear weight since then but she does have lateral ankle pain and swelling.  Denies any other injuries.  Did not hit her head or pass out.     Past Medical History:  Diagnosis Date  . Asthma   . Ovarian torsion     Patient Active Problem List   Diagnosis Date Noted  . Right knee pain 06/26/2011    History reviewed. No pertinent surgical history.   OB History   No obstetric history on file.      Home Medications    Prior to Admission medications   Medication Sig Start Date End Date Taking? Authorizing Provider  acetaminophen-codeine 120-12 MG/5ML solution Take 10 mLs by mouth every 4 (four) hours as needed for moderate pain. 11/06/15   Lawyer, Harrell Gave, PA-C  albuterol (PROVENTIL) (2.5 MG/3ML) 0.083% nebulizer solution Take 2.5 mg by nebulization every 6 (six) hours as needed. Shortness of breath    [provider]  cephALEXin (KEFLEX) 500 MG capsule Take 1 capsule (500 mg total) by mouth 3 (three) times daily. 05/17/18   Ward, Delice Bison, DO  famotidine (PEPCID) 20 MG tablet Take 1 tablet (20 mg total) by mouth 2 (two) times daily. 03/30/15   Palumbo, April, MD  levonorgestrel-ethinyl estradiol (AVIANE,ALESSE,LESSINA) 0.1-20 MG-MCG tablet Take 1 tablet by mouth daily.    [provider]  lidocaine (XYLOCAINE) 5 % ointment Apply 1 application topically as needed. 05/17/18   Ward, Delice Bison, DO   naproxen (NAPROSYN) 500 MG tablet Take 1 tablet (500 mg total) by mouth 2 (two) times daily. 03/11/18   Veryl Speak, MD  nitrofurantoin, macrocrystal-monohydrate, (MACROBID) 100 MG capsule Take 1 capsule (100 mg total) by mouth 2 (two) times daily. 06/25/13   Glendell Docker, NP  nystatin-triamcinolone (MYCOLOG II) cream Apply to affected area daily 05/17/18   Ward, Delice Bison, DO  Probiotic CAPS Take 1 capsule by mouth daily. 05/17/18   Ward, Delice Bison, DO  traMADol (ULTRAM) 50 MG tablet Take 1 tablet (50 mg total) by mouth every 6 (six) hours as needed. 03/11/18   Veryl Speak, MD  triamcinolone cream (KENALOG) 0.1 % Apply 1 application topically 2 (two) times daily. 11/16/13   Fransico Meadow, PA-C    Family History Family History  Problem Relation Age of Onset  . Hyperlipidemia Mother   . Hypertension Mother   . Sudden death Neg Hx   . Heart attack Neg Hx   . Diabetes Neg Hx     Social History Social History   Tobacco Use  . Smoking status: Former Smoker    Packs/day: 0.50  . Smokeless tobacco: Never Used  Substance Use Topics  . Alcohol use: No  . Drug use: Not Currently     Allergies   Fish-derived products; Red dye; and Sulfa antibiotics   Review of Systems Review of Systems  Constitutional: Negative for  fever.  Gastrointestinal: Negative for nausea and vomiting.  Musculoskeletal: Positive for arthralgias, gait problem and joint swelling. Negative for back pain, neck pain and neck stiffness.  Skin: Negative for wound.  Neurological: Negative for syncope, weakness and numbness.  All other systems reviewed and are negative.    Physical Exam Updated Vital Signs BP 119/73 (BP Location: Left Arm)   Pulse 86   Temp 98.3 F (36.8 C) (Oral)   Resp 18   LMP 08/09/2018   SpO2 98%   Physical Exam Vitals signs and nursing note reviewed.  Constitutional:      Appearance: Normal appearance.  HENT:     Head: Normocephalic.  Eyes:     Conjunctiva/sclera:  Conjunctivae normal.  Pulmonary:     Effort: Pulmonary effort is normal.  Musculoskeletal:     Right ankle: She exhibits normal range of motion, no swelling, no ecchymosis, no deformity, no laceration and normal pulse. Tenderness. Lateral malleolus and AITFL tenderness found. No medial malleolus, no CF ligament, no posterior TFL, no head of 5th metatarsal and no proximal fibula tenderness found. Achilles tendon exhibits no pain.     Right foot: Normal.  Skin:    General: Skin is dry.     Findings: No bruising.  Neurological:     Mental Status: She is alert.  Psychiatric:        Mood and Affect: Mood normal.      ED Treatments / Results  Labs (all labs ordered are listed, but only abnormal results are displayed) Labs Reviewed - No data to display  EKG None  Radiology Dg Ankle Complete Right  Result Date: 08/18/2018 CLINICAL DATA:  Pain status post fall EXAM: RIGHT ANKLE - COMPLETE 3+ VIEW COMPARISON:  08/18/2018. FINDINGS: There is no evidence of fracture, dislocation, or joint effusion. There is no evidence of arthropathy or other focal bone abnormality. Soft tissues are unremarkable. IMPRESSION: Negative. Electronically Signed   By: Katherine Mantlehristopher  Green M.D.   On: 08/18/2018 19:52   Dg Foot Complete Right  Result Date: 08/18/2018 CLINICAL DATA:  Initial evaluation for acute injury, fall. Bruising to fifth metatarsal. EXAM: RIGHT FOOT COMPLETE - 3+ VIEW COMPARISON:  None. FINDINGS: There is no evidence of fracture or dislocation. There is no evidence of arthropathy or other focal bone abnormality. Soft tissues are unremarkable. IMPRESSION: No acute osseous abnormality about the right foot. Electronically Signed   By: Rise MuBenjamin  McClintock M.D.   On: 08/18/2018 19:28    Procedures Procedures (including critical care time)  Medications Ordered in ED Medications - No data to display   Initial Impression / Assessment and Plan / ED Course  I have reviewed the triage vital signs and  the nursing notes.  Pertinent labs & imaging results that were available during my care of the patient were reviewed by me and considered in my medical decision making (see chart for details).  Clinical Course as of Aug 17 2017  Mon Aug 18, 2018  2017 Negative xrays. Patient is able to bear weight. Advised RICE treatment, bear weight as soon as tolerated.    [KM]    Clinical Course User Index [KM] Arlyn DunningMcLean, Nathalee Smarr A, PA-C         Final Clinical Impressions(s) / ED Diagnoses   Final diagnoses:  Sprain of right ankle, unspecified ligament, initial encounter    ED Discharge Orders    None       Jeral PinchMcLean, Teddi Badalamenti A, PA-C 08/18/18 2019    Melene PlanFloyd, Dan, DO 08/18/18 2023

## 2018-08-18 NOTE — Discharge Instructions (Signed)
Thank you for allowing me to care for you today. Please return to the emergency department if you have new or worsening symptoms. Take your medications as instructed.  ° °

## 2018-09-07 ENCOUNTER — Other Ambulatory Visit: Payer: Self-pay

## 2018-09-07 ENCOUNTER — Emergency Department (HOSPITAL_BASED_OUTPATIENT_CLINIC_OR_DEPARTMENT_OTHER)
Admission: EM | Admit: 2018-09-07 | Discharge: 2018-09-07 | Disposition: A | Discharge status: Home / Self Care | Payer: Medicaid Other | Attending: Emergency Medicine | Admitting: Emergency Medicine

## 2018-09-07 ENCOUNTER — Emergency Department (HOSPITAL_BASED_OUTPATIENT_CLINIC_OR_DEPARTMENT_OTHER): Payer: Medicaid Other

## 2018-09-07 ENCOUNTER — Encounter (HOSPITAL_BASED_OUTPATIENT_CLINIC_OR_DEPARTMENT_OTHER): Payer: Self-pay | Admitting: Emergency Medicine

## 2018-09-07 DIAGNOSIS — Z79899 Other long term (current) drug therapy: Secondary | ICD-10-CM | POA: Insufficient documentation

## 2018-09-07 DIAGNOSIS — Z87891 Personal history of nicotine dependence: Secondary | ICD-10-CM | POA: Insufficient documentation

## 2018-09-07 DIAGNOSIS — M79671 Pain in right foot: Secondary | ICD-10-CM

## 2018-09-07 NOTE — ED Triage Notes (Signed)
Pt here with little toe pain on the right and radiating up the lateral side of the foot.

## 2018-09-07 NOTE — ED Provider Notes (Signed)
[facility] EMERGENCY DEPARTMENT Provider Note   CSN: [id number] Arrival date & time: 09/07/18  1547     History   Chief Complaint Chief Complaint  Patient presents with  . Foot Pain    HPI Annette Middleton is a 23 y.o. female.     Patient presents to the emergency department with acute onset of right foot pain today.  Patient has pain in her small toe and up to the base of the toe.  Pain is worse with palpation and movement.  Patient was wearing heels today and stepped into a hole.  Her boyfriend caught her before she fell to the ground and she is uncertain exactly how she landed.  She states that she felt a pop.  Patient had a injury to the same foot earlier this month which resolved.  No numbness or tingling.  No treatments prior to arrival.     Past Medical History:  Diagnosis Date  . Asthma   . Ovarian torsion     Patient Active Problem List   Diagnosis Date Noted  . Right knee pain 06/26/2011    History reviewed. No pertinent surgical history.   OB History   No obstetric history on file.      Home Medications    Prior to Admission medications   Medication Sig Start Date End Date Taking? Authorizing Provider  acetaminophen-codeine 120-12 MG/5ML solution Take 10 mLs by mouth every 4 (four) hours as needed for moderate pain. 11/06/15   Lawyer, Harrell Gave, PA-C  albuterol (PROVENTIL) (2.5 MG/3ML) 0.083% nebulizer solution Take 2.5 mg by nebulization every 6 (six) hours as needed. Shortness of breath    [provider]  cephALEXin (KEFLEX) 500 MG capsule Take 1 capsule (500 mg total) by mouth 3 (three) times daily. 05/17/18   Ward, Delice Bison, DO  famotidine (PEPCID) 20 MG tablet Take 1 tablet (20 mg total) by mouth 2 (two) times daily. 03/30/15   Palumbo, April, MD  levonorgestrel-ethinyl estradiol (AVIANE,ALESSE,LESSINA) 0.1-20 MG-MCG tablet Take 1 tablet by mouth daily.    [provider]  lidocaine (XYLOCAINE) 5 % ointment Apply 1  application topically as needed. 05/17/18   Ward, Delice Bison, DO  naproxen (NAPROSYN) 500 MG tablet Take 1 tablet (500 mg total) by mouth 2 (two) times daily. 03/11/18   Veryl Speak, MD  nitrofurantoin, macrocrystal-monohydrate, (MACROBID) 100 MG capsule Take 1 capsule (100 mg total) by mouth 2 (two) times daily. 06/25/13   Glendell Docker, NP  nystatin-triamcinolone (MYCOLOG II) cream Apply to affected area daily 05/17/18   Ward, Delice Bison, DO  Probiotic CAPS Take 1 capsule by mouth daily. 05/17/18   Ward, Delice Bison, DO  traMADol (ULTRAM) 50 MG tablet Take 1 tablet (50 mg total) by mouth every 6 (six) hours as needed. 03/11/18   Veryl Speak, MD  triamcinolone cream (KENALOG) 0.1 % Apply 1 application topically 2 (two) times daily. 11/16/13   Fransico Meadow, PA-C    Family History Family History  Problem Relation Age of Onset  . Hyperlipidemia Mother   . Hypertension Mother   . Sudden death Neg Hx   . Heart attack Neg Hx   . Diabetes Neg Hx     Social History Social History   Tobacco Use  . Smoking status: Former Smoker    Packs/day: 0.50  . Smokeless tobacco: Never Used  Substance Use Topics  . Alcohol use: No  . Drug use: Not Currently     Allergies   Fish-derived products,  Red dye, and Sulfa antibiotics   Review of Systems Review of Systems  Constitutional: Negative for activity change.  Musculoskeletal: Positive for arthralgias. Negative for back pain, joint swelling and neck pain.  Skin: Negative for wound.  Neurological: Negative for weakness and numbness.     Physical Exam Updated Vital Signs BP 114/73 (BP Location: Left Arm)   Pulse 83   Temp 98.1 F (36.7 C) (Oral)   Resp 16   Ht 5\' 2"  (1.575 m)   Wt 64.9 kg   LMP 08/09/2018 (Exact Date)   SpO2 100%   BMI 26.16 kg/m   Physical Exam Vitals signs and nursing note reviewed.  Constitutional:      Appearance: She is well-developed.  HENT:     Head: Normocephalic and atraumatic.  Eyes:      Conjunctiva/sclera: Conjunctivae normal.  Neck:     Musculoskeletal: Normal range of motion and neck supple.  Pulmonary:     Effort: No respiratory distress.  Musculoskeletal:     Right knee: Normal.     Right ankle: She exhibits normal range of motion, no swelling and no ecchymosis. No tenderness. No lateral malleolus and no medial malleolus tenderness found.     Right lower leg: Normal.     Right foot: Decreased range of motion. Tenderness and bony tenderness present. No deformity.       Feet:  Skin:    General: Skin is warm and dry.  Neurological:     Mental Status: She is alert.      ED Treatments / Results  Labs (all labs ordered are listed, but only abnormal results are displayed) Labs Reviewed - No data to display  EKG    Radiology Dg Foot Complete Right  Result Date: 09/07/2018 CLINICAL DATA:  Foot pain post fall, pain at fifth toe and fifth metatarsal EXAM: RIGHT FOOT COMPLETE - 3+ VIEW COMPARISON:  08/18/2018 FINDINGS: Osseous mineralization normal. Joint spaces preserved. No acute fracture, dislocation, or bone destruction. IMPRESSION: Normal exam. Electronically Signed   By: Ulyses SouthwardMark  Boles M.D.   On: 09/07/2018 17:10    Procedures Procedures (including critical care time)  Medications Ordered in ED Medications - No data to display   Initial Impression / Assessment and Plan / ED Course  I have reviewed the triage vital signs and the nursing notes.  Pertinent labs & imaging results that were available during my care of the patient were reviewed by me and considered in my medical decision making (see chart for details).        Patient seen and examined.  X-ray personally reviewed.  Patient has crutches at bedside.  She will continue use of crutches, rice protocol, NSAIDs.  Encouraged orthopedic follow-up if she continues to have significant pain or disability in 1 week.  Referral given.  Vital signs reviewed and are as follows: BP 114/73 (BP Location: Left  Arm)   Pulse 83   Temp 98.1 F (36.7 C) (Oral)   Resp 16   Ht 5\' 2"  (1.575 m)   Wt 64.9 kg   LMP 08/09/2018 (Exact Date)   SpO2 100%   BMI 26.16 kg/m     Final Clinical Impressions(s) / ED Diagnoses   Final diagnoses:  Right foot pain   Patient with right foot pain after an injury today.  X-rays are negative.  Low concern for occult fracture.  Foot and toes are neurovascularly intact.  Conservative measures indicated with follow-up as above.  ED Discharge Orders    None  Renne CriglerGeiple, Jaritza Duignan, PA-C 09/07/18 1743    Gwyneth SproutPlunkett, Whitney, MD 09/08/18 1539

## 2018-09-07 NOTE — Discharge Instructions (Signed)
Please read and follow all provided instructions.  Your diagnoses today include:  1. Right foot pain     Tests performed today include:  An x-ray of the affected area - does NOT show any broken bones  Vital signs. See below for your results today.   Medications prescribed:  Please use over-the-counter NSAID medications (ibuprofen, naproxen) as directed on the packaging for pain.   Take any prescribed medications only as directed.  Home care instructions:   Follow any educational materials contained in this packet  Follow R.I.C.E. Protocol:  R - rest your injury   I  - use ice on injury without applying directly to skin  C - compress injury with bandage or splint  E - elevate the injury as much as possible  Follow-up instructions: Please follow-up with your primary care provider or the provided orthopedic physician (bone specialist) if you continue to have significant pain in 1 week. In this case you may have a more severe injury that requires further care.   Return instructions:   Please return if your toes or feet are numb or tingling, appear gray or blue, or you have severe pain (also elevate the leg and loosen splint or wrap if you were given one)  Please return to the Emergency Department if you experience worsening symptoms.   Please return if you have any other emergent concerns.  Additional Information:  Your vital signs today were: BP 114/73 (BP Location: Left Arm)    Pulse 83    Temp 98.1 F (36.7 C) (Oral)    Resp 16    Ht 5\' 2"  (1.575 m)    Wt 64.9 kg    LMP 08/09/2018 (Exact Date)    SpO2 100%    BMI 26.16 kg/m  If your blood pressure (BP) was elevated above 135/85 this visit, please have this repeated by your doctor within one month. --------------

## 2018-10-07 ENCOUNTER — Encounter (HOSPITAL_BASED_OUTPATIENT_CLINIC_OR_DEPARTMENT_OTHER): Payer: Self-pay | Admitting: *Deleted

## 2018-10-07 ENCOUNTER — Emergency Department (HOSPITAL_BASED_OUTPATIENT_CLINIC_OR_DEPARTMENT_OTHER)
Admission: EM | Admit: 2018-10-07 | Discharge: 2018-10-07 | Disposition: A | Discharge status: Home / Self Care | Payer: Medicaid Other | Attending: Emergency Medicine | Admitting: Emergency Medicine

## 2018-10-07 ENCOUNTER — Other Ambulatory Visit: Payer: Self-pay

## 2018-10-07 DIAGNOSIS — R0981 Nasal congestion: Secondary | ICD-10-CM | POA: Insufficient documentation

## 2018-10-07 DIAGNOSIS — N3090 Cystitis, unspecified without hematuria: Secondary | ICD-10-CM | POA: Insufficient documentation

## 2018-10-07 DIAGNOSIS — R110 Nausea: Secondary | ICD-10-CM

## 2018-10-07 DIAGNOSIS — N3000 Acute cystitis without hematuria: Secondary | ICD-10-CM

## 2018-10-07 DIAGNOSIS — J45909 Unspecified asthma, uncomplicated: Secondary | ICD-10-CM | POA: Insufficient documentation

## 2018-10-07 DIAGNOSIS — Z20828 Contact with and (suspected) exposure to other viral communicable diseases: Secondary | ICD-10-CM | POA: Insufficient documentation

## 2018-10-07 DIAGNOSIS — Z20822 Contact with and (suspected) exposure to covid-19: Secondary | ICD-10-CM

## 2018-10-07 DIAGNOSIS — Z87891 Personal history of nicotine dependence: Secondary | ICD-10-CM | POA: Insufficient documentation

## 2018-10-07 LAB — CBC WITH DIFFERENTIAL/PLATELET
Abs Immature Granulocytes: 0.01 10*3/uL (ref 0.00–0.07)
Basophils Absolute: 0 10*3/uL (ref 0.0–0.1)
Basophils Relative: 1 %
Eosinophils Absolute: 0.1 10*3/uL (ref 0.0–0.5)
Eosinophils Relative: 1 %
HCT: 40.9 % (ref 36.0–46.0)
Hemoglobin: 13.3 g/dL (ref 12.0–15.0)
Immature Granulocytes: 0 %
Lymphocytes Relative: 23 %
Lymphs Abs: 1.5 10*3/uL (ref 0.7–4.0)
MCH: 29.9 pg (ref 26.0–34.0)
MCHC: 32.5 g/dL (ref 30.0–36.0)
MCV: 91.9 fL (ref 80.0–100.0)
Monocytes Absolute: 0.8 10*3/uL (ref 0.1–1.0)
Monocytes Relative: 12 %
Neutro Abs: 4 10*3/uL (ref 1.7–7.7)
Neutrophils Relative %: 63 %
Platelets: 297 10*3/uL (ref 150–400)
RBC: 4.45 MIL/uL (ref 3.87–5.11)
RDW: 11.8 % (ref 11.5–15.5)
WBC: 6.3 10*3/uL (ref 4.0–10.5)
nRBC: 0 % (ref 0.0–0.2)

## 2018-10-07 LAB — URINALYSIS, ROUTINE W REFLEX MICROSCOPIC
Bilirubin Urine: NEGATIVE
Glucose, UA: NEGATIVE mg/dL
Hgb urine dipstick: NEGATIVE
Ketones, ur: NEGATIVE mg/dL
Nitrite: NEGATIVE
Protein, ur: NEGATIVE mg/dL
Specific Gravity, Urine: 1.015 (ref 1.005–1.030)
pH: 6.5 (ref 5.0–8.0)

## 2018-10-07 LAB — COMPREHENSIVE METABOLIC PANEL
ALT: 12 U/L (ref 0–44)
AST: 14 U/L — ABNORMAL LOW (ref 15–41)
Albumin: 3.7 g/dL (ref 3.5–5.0)
Alkaline Phosphatase: 78 U/L (ref 38–126)
Anion gap: 7 (ref 5–15)
BUN: 7 mg/dL (ref 6–20)
CO2: 24 mmol/L (ref 22–32)
Calcium: 9 mg/dL (ref 8.9–10.3)
Chloride: 107 mmol/L (ref 98–111)
Creatinine, Ser: 0.51 mg/dL (ref 0.44–1.00)
GFR calc Af Amer: >60 mL/min (ref >60)
GFR calc non Af Amer: >60 mL/min (ref >60)
Glucose, Bld: 102 mg/dL — ABNORMAL HIGH (ref 70–99)
Potassium: 3.6 mmol/L (ref 3.5–5.1)
Sodium: 138 mmol/L (ref 135–145)
Total Bilirubin: 0.4 mg/dL (ref 0.3–1.2)
Total Protein: 7.1 g/dL (ref 6.5–8.1)

## 2018-10-07 LAB — PREGNANCY, URINE: Preg Test, Ur: NEGATIVE

## 2018-10-07 LAB — URINALYSIS, MICROSCOPIC (REFLEX)

## 2018-10-07 LAB — LIPASE, BLOOD: Lipase: 26 U/L (ref 11–51)

## 2018-10-07 MED ORDER — SODIUM CHLORIDE 0.9 % IV BOLUS
1000.0000 mL | Freq: Once | INTRAVENOUS | Status: AC
  Administered 2018-10-07: 19:00:00 1000 mL via INTRAVENOUS

## 2018-10-07 MED ORDER — CEPHALEXIN 500 MG PO CAPS: 500.0000 mg | ORAL_CAPSULE | Freq: Four times a day (QID) | ORAL | 0 refills | Status: DC

## 2018-10-07 MED ORDER — OXYMETAZOLINE HCL 0.05 % NA SOLN
1.0000 | Freq: Once | NASAL | Status: AC
  Administered 2018-10-07: 18:00:00 1 via NASAL
  Filled 2018-10-07: qty 30

## 2018-10-07 MED ORDER — ONDANSETRON HCL 4 MG/2ML IJ SOLN
4.0000 mg | Freq: Once | INTRAMUSCULAR | Status: AC
  Administered 2018-10-07: 18:00:00 4 mg via INTRAVENOUS
  Filled 2018-10-07: qty 2

## 2018-10-07 MED ORDER — ONDANSETRON 4 MG PO TBDP: 4.0000 mg | ORAL_TABLET | Freq: Three times a day (TID) | ORAL | 0 refills | Status: DC | PRN

## 2018-10-07 NOTE — Progress Notes (Cosign Needed)
Annette Middleton ED TOC CM -referral 5 ED visits in six months  TOC CM spoke to pt and states she started a new job that will afford her to have health insurance. Her insurance will start in a month. She plans to look for a PCP to follow her in the community. Agreeable to phone call from CM in a month to assist with PCP. Jonnie Finner RN CCM Case Mgmt phone [phone]

## 2018-10-07 NOTE — ED Notes (Signed)
ED Provider at bedside. 

## 2018-10-07 NOTE — ED Triage Notes (Signed)
Headache and nasal congestion x 2 days.

## 2018-10-07 NOTE — ED Provider Notes (Signed)
[facility] EMERGENCY DEPARTMENT Provider Note   CSN: [id number] Arrival date & time: 10/07/18  1722    History   Chief Complaint Chief Complaint  Patient presents with  . Headache  . Nasal Congestion    HPI Annette Middleton is a 23 y.o. female.     Pt presents to the ED today with headache, nasal congestion, and nausea.  Pt said she has not been feeling well for a few days.  She is a delivery driver for car parts.  She has no known covid exposures.  No f/c.  No sob.  Pt is worried she may have a uti as she's had dysuria.     Past Medical History:  Diagnosis Date  . Asthma   . Ovarian torsion     Patient Active Problem List   Diagnosis Date Noted  . Right knee pain 06/26/2011    History reviewed. No pertinent surgical history.   OB History   No obstetric history on file.      Home Medications    Prior to Admission medications   Medication Sig Start Date End Date Taking? Authorizing Provider  acetaminophen-codeine 120-12 MG/5ML solution Take 10 mLs by mouth every 4 (four) hours as needed for moderate pain. 11/06/15   Lawyer, Harrell Gave, PA-C  albuterol (PROVENTIL) (2.5 MG/3ML) 0.083% nebulizer solution Take 2.5 mg by nebulization every 6 (six) hours as needed. Shortness of breath    [provider]  cephALEXin (KEFLEX) 500 MG capsule Take 1 capsule (500 mg total) by mouth 4 (four) times daily. 10/07/18   Isla Pence, MD  famotidine (PEPCID) 20 MG tablet Take 1 tablet (20 mg total) by mouth 2 (two) times daily. 03/30/15   Palumbo, April, MD  levonorgestrel-ethinyl estradiol (AVIANE,ALESSE,LESSINA) 0.1-20 MG-MCG tablet Take 1 tablet by mouth daily.    [provider]  lidocaine (XYLOCAINE) 5 % ointment Apply 1 application topically as needed. 05/17/18   Ward, Delice Bison, DO  naproxen (NAPROSYN) 500 MG tablet Take 1 tablet (500 mg total) by mouth 2 (two) times daily. 03/11/18   Veryl Speak, MD  nitrofurantoin, macrocrystal-monohydrate,  (MACROBID) 100 MG capsule Take 1 capsule (100 mg total) by mouth 2 (two) times daily. 06/25/13   Glendell Docker, NP  nystatin-triamcinolone (MYCOLOG II) cream Apply to affected area daily 05/17/18   Ward, Delice Bison, DO  ondansetron (ZOFRAN ODT) 4 MG disintegrating tablet Take 1 tablet (4 mg total) by mouth every 8 (eight) hours as needed. 10/07/18   Isla Pence, MD  Probiotic CAPS Take 1 capsule by mouth daily. 05/17/18   Ward, Delice Bison, DO  traMADol (ULTRAM) 50 MG tablet Take 1 tablet (50 mg total) by mouth every 6 (six) hours as needed. 03/11/18   Veryl Speak, MD  triamcinolone cream (KENALOG) 0.1 % Apply 1 application topically 2 (two) times daily. 11/16/13   Fransico Meadow, PA-C    Family History Family History  Problem Relation Age of Onset  . Hyperlipidemia Mother   . Hypertension Mother   . Sudden death Neg Hx   . Heart attack Neg Hx   . Diabetes Neg Hx     Social History Social History   Tobacco Use  . Smoking status: Former Smoker    Packs/day: 0.50  . Smokeless tobacco: Never Used  Substance Use Topics  . Alcohol use: No  . Drug use: Not Currently     Allergies   Fish-derived products, Red dye, and Sulfa antibiotics   Review of Systems Review of  Systems  HENT: Positive for sinus pressure and sinus pain.   Gastrointestinal: Positive for nausea.  Neurological: Positive for headaches.  All other systems reviewed and are negative.    Physical Exam Updated Vital Signs BP 119/80   Pulse 73   Temp 98.6 F (37 C) (Oral)   Resp 16   Ht 5\' 2"  (1.575 m)   Wt 65.8 kg   SpO2 100%   BMI 26.52 kg/m   Physical Exam Vitals signs and nursing note reviewed.  Constitutional:      Appearance: She is well-developed.  HENT:     Head: Normocephalic and atraumatic.     Mouth/Throat:     Mouth: Mucous membranes are moist.     Pharynx: Oropharynx is clear.  Eyes:     Extraocular Movements: Extraocular movements intact.     Pupils: Pupils are equal, round, and  reactive to light.  Neck:     Musculoskeletal: Normal range of motion and neck supple.  Cardiovascular:     Rate and Rhythm: Normal rate and regular rhythm.     Heart sounds: Normal heart sounds.  Pulmonary:     Effort: Pulmonary effort is normal.     Breath sounds: Normal breath sounds.  Abdominal:     General: Bowel sounds are normal.     Palpations: Abdomen is soft.  Musculoskeletal: Normal range of motion.  Skin:    General: Skin is warm.     Capillary Refill: Capillary refill takes less than 2 seconds.  Neurological:     Mental Status: She is alert and oriented to person, place, and time.  Psychiatric:        Mood and Affect: Mood normal.        Speech: Speech normal.        Behavior: Behavior normal.      ED Treatments / Results  Labs (all labs ordered are listed, but only abnormal results are displayed) Labs Reviewed  URINALYSIS, ROUTINE W REFLEX MICROSCOPIC - Abnormal; Notable for the following components:      Result Value   Leukocytes,Ua SMALL (*)    All other components within normal limits  URINALYSIS, MICROSCOPIC (REFLEX) - Abnormal; Notable for the following components:   Bacteria, UA FEW (*)    All other components within normal limits  COMPREHENSIVE METABOLIC PANEL - Abnormal; Notable for the following components:   Glucose, Bld 102 (*)    AST 14 (*)    All other components within normal limits  NOVEL CORONAVIRUS, NAA (HOSPITAL ORDER, SEND-OUT TO REF LAB)  PREGNANCY, URINE  CBC WITH DIFFERENTIAL/PLATELET  LIPASE, BLOOD    EKG None  Radiology No results found.  Procedures Procedures (including critical care time)  Medications Ordered in ED Medications  sodium chloride 0.9 % bolus 1,000 mL (1,000 mLs Intravenous New Bag/Given 10/07/18 1832)  ondansetron (ZOFRAN) injection 4 mg (4 mg Intravenous Given 10/07/18 1826)  oxymetazoline (AFRIN) 0.05 % nasal spray 1 spray (1 spray Each Nare Given 10/07/18 1826)     Initial Impression / Assessment and  Plan / ED Course  I have reviewed the triage vital signs and the nursing notes.  Pertinent labs & imaging results that were available during my care of the patient were reviewed by me and considered in my medical decision making (see chart for details).   Pt is symptomatic from a uti, so she is treated with keflex.  I suspect she has covid.  Pt is oxygenating well.  Pt is told to self-isolate until  test comes back.  Return if worse.   Final Clinical Impressions(s) / ED Diagnoses   Final diagnoses:  Suspected Covid-19 Virus Infection  Acute cystitis without hematuria  Nausea    ED Discharge Orders         Ordered    cephALEXin (KEFLEX) 500 MG capsule  4 times daily     10/07/18 1920    ondansetron (ZOFRAN ODT) 4 MG disintegrating tablet  Every 8 hours PRN     10/07/18 Autumn Messing1920           Jamarr Treinen, MD 10/07/18 1921

## 2018-10-09 LAB — NOVEL CORONAVIRUS, NAA (HOSP ORDER, SEND-OUT TO REF LAB; TAT 18-24 HRS): SARS-CoV-2, NAA: NOT DETECTED

## 2018-12-16 ENCOUNTER — Emergency Department (HOSPITAL_BASED_OUTPATIENT_CLINIC_OR_DEPARTMENT_OTHER): Payer: 59

## 2018-12-16 ENCOUNTER — Other Ambulatory Visit: Payer: Self-pay

## 2018-12-16 ENCOUNTER — Encounter (HOSPITAL_BASED_OUTPATIENT_CLINIC_OR_DEPARTMENT_OTHER): Payer: Self-pay | Admitting: Emergency Medicine

## 2018-12-16 ENCOUNTER — Emergency Department (HOSPITAL_BASED_OUTPATIENT_CLINIC_OR_DEPARTMENT_OTHER)
Admission: EM | Admit: 2018-12-16 | Discharge: 2018-12-16 | Disposition: A | Discharge status: Home / Self Care | Payer: 59 | Attending: Emergency Medicine | Admitting: Emergency Medicine

## 2018-12-16 DIAGNOSIS — M25531 Pain in right wrist: Secondary | ICD-10-CM | POA: Insufficient documentation

## 2018-12-16 DIAGNOSIS — Z87891 Personal history of nicotine dependence: Secondary | ICD-10-CM | POA: Insufficient documentation

## 2018-12-16 DIAGNOSIS — J45909 Unspecified asthma, uncomplicated: Secondary | ICD-10-CM | POA: Insufficient documentation

## 2018-12-16 MED ORDER — IBUPROFEN 600 MG PO TABS: 600.0000 mg | ORAL_TABLET | Freq: Three times a day (TID) | ORAL | 0 refills | Status: AC

## 2018-12-16 NOTE — ED Provider Notes (Signed)
[facility] EMERGENCY DEPARTMENT Provider Note   CSN: [id number] Arrival date & time: 12/16/18  1446     History   Chief Complaint Chief Complaint  Patient presents with  . Wrist Pain    HPI Annette Middleton is a 23 y.o. female.     23 year old female presents with complaint of right wrist pain for the past week and a half.  Patient is left-hand dominant, denies any known injuries to her wrist, states she does a lot of lifting at work but with no known specific injury.  Pain is worse with certain movements, reports some swelling to the area.  No other complaints or concerns.     Past Medical History:  Diagnosis Date  . Asthma   . Ovarian torsion     Patient Active Problem List   Diagnosis Date Noted  . Right knee pain 06/26/2011    History reviewed. No pertinent surgical history.   OB History   No obstetric history on file.      Home Medications    Prior to Admission medications   Medication Sig Start Date End Date Taking? Authorizing Provider  albuterol (PROVENTIL) (2.5 MG/3ML) 0.083% nebulizer solution Take 2.5 mg by nebulization every 6 (six) hours as needed. Shortness of breath    [provider]  famotidine (PEPCID) 20 MG tablet Take 1 tablet (20 mg total) by mouth 2 (two) times daily. 03/30/15   Palumbo, April, MD  ibuprofen (ADVIL) 600 MG tablet Take 1 tablet (600 mg total) by mouth 3 (three) times daily for 7 days. 12/16/18 12/23/18  Jeannie Fend, PA-C  levonorgestrel-ethinyl estradiol (AVIANE,ALESSE,LESSINA) 0.1-20 MG-MCG tablet Take 1 tablet by mouth daily.    [provider]  ondansetron (ZOFRAN ODT) 4 MG disintegrating tablet Take 1 tablet (4 mg total) by mouth every 8 (eight) hours as needed. 10/07/18   Jacalyn Lefevre, MD  Probiotic CAPS Take 1 capsule by mouth daily. 05/17/18   Ward, Layla Maw, DO  traMADol (ULTRAM) 50 MG tablet Take 1 tablet (50 mg total) by mouth every 6 (six) hours as needed. 03/11/18   Geoffery Lyons, MD     Family History Family History  Problem Relation Age of Onset  . Hyperlipidemia Mother   . Hypertension Mother   . Sudden death Neg Hx   . Heart attack Neg Hx   . Diabetes Neg Hx     Social History Social History   Tobacco Use  . Smoking status: Former Smoker    Packs/day: 0.50  . Smokeless tobacco: Never Used  Substance Use Topics  . Alcohol use: No  . Drug use: Not Currently     Allergies   Fish-derived products, Red dye, and Sulfa antibiotics   Review of Systems Review of Systems  Constitutional: Negative for fever.  Musculoskeletal: Positive for arthralgias and joint swelling.  Skin: Negative for color change, rash and wound.  Allergic/Immunologic: Negative for immunocompromised state.  Neurological: Negative for weakness and numbness.  All other systems reviewed and are negative.    Physical Exam Updated Vital Signs Ht 5\' 2"  (1.575 m)   Wt 63.5 kg   LMP 12/08/2018   BMI 25.61 kg/m   Physical Exam Vitals signs and nursing note reviewed.  Constitutional:      General: She is not in acute distress.    Appearance: She is well-developed. She is not diaphoretic.  HENT:     Head: Normocephalic and atraumatic.  Cardiovascular:     Pulses: Normal pulses.  Pulmonary:  Effort: Pulmonary effort is normal.  Musculoskeletal: Normal range of motion.        General: Swelling and tenderness present. No deformity.       Arms:  Skin:    General: Skin is warm and dry.     Findings: No erythema or rash.  Neurological:     Mental Status: She is alert and oriented to person, place, and time.     Sensory: No sensory deficit.  Psychiatric:        Behavior: Behavior normal.      ED Treatments / Results  Labs (all labs ordered are listed, but only abnormal results are displayed) Labs Reviewed - No data to display  EKG None  Radiology Dg Wrist Complete Right  Result Date: 12/16/2018 CLINICAL DATA:  Right wrist pain for 1 week. Unknown injury.  Patient does lift heavy objects at work. EXAM: RIGHT WRIST - COMPLETE 3+ VIEW COMPARISON:  None. FINDINGS: There is no evidence of fracture or dislocation. There is no evidence of arthropathy or other focal bone abnormality. Soft tissues are unremarkable. IMPRESSION: Negative. Electronically Signed   By: Lajean Manes M.D.   On: 12/16/2018 15:40    Procedures Procedures (including critical care time)  Medications Ordered in ED Medications - No data to display   Initial Impression / Assessment and Plan / ED Course  I have reviewed the triage vital signs and the nursing notes.  Pertinent labs & imaging results that were available during my care of the patient were reviewed by me and considered in my medical decision making (see chart for details).  Clinical Course as of Dec 15 1604  Tue Dec 15, 1041  5127 23 year old female presents with complaint of right wrist pain for the past week and a half without injury.  On exam patient has tenderness at the triquetrum/lunate area the palmar aspect of the right hand, pain worse with ulnar deviation and wrist extension.  X-ray is negative for bony injury.  Patient was placed in a Velcro splint, advised to take ibuprofen 3 times daily for the next week with food, follow-up with sports medicine if not improving.   [LM]    Clinical Course User Index [LM] Tacy Learn, PA-C      Final Clinical Impressions(s) / ED Diagnoses   Final diagnoses:  Right wrist pain    ED Discharge Orders         Ordered    ibuprofen (ADVIL) 600 MG tablet  3 times daily     12/16/18 1600           Roque Lias 12/16/18 1606    Dorie Rank, MD 12/17/18 1134

## 2018-12-16 NOTE — Discharge Instructions (Addendum)
Wear splint except for bathing. Take Motrin three times daily with food for 1 week. Follow up with orthopedics if not improving.

## 2018-12-16 NOTE — ED Triage Notes (Signed)
R wrist pain x 1 week. Unknown injury, she lifts heavy objects at work.

## 2018-12-22 NOTE — Progress Notes (Signed)
Annette Middleton - 23 y.o. female MRN [id number]  Date of birth: 1995/06/25  SUBJECTIVE:  Including CC & ROS.  Chief Complaint  Patient presents with  . Wrist Pain    right wrist x 3 weeks    Annette Middleton is a 23 y.o. female that is presenting with right wrist pain.  The pain is been ongoing for about 3 weeks.  She noticed redness and tenderness at the base of the hand in the ulnar aspect of the wrist.  No inciting event or trauma.  She works with her hands on a regular basis.  She was seen in the emergency department on 10/6 and provided a brace.  She feels the brace helps because it helps protect the area from getting hit.  No prior history of similar symptoms.  Her sister has juvenile rheumatoid arthritis.  Her mother has psoriatic arthritis.  Seems to be localized to this area.  No history of similar symptoms.  Independent review of the right wrist x-ray from 10/6 shows no acute abnormality.   Review of Systems  Constitutional: Negative for fever.  HENT: Negative for congestion.   Respiratory: Negative for cough.   Cardiovascular: Negative for chest pain.  Gastrointestinal: Negative for abdominal distention.  Musculoskeletal: Negative for gait problem.  Skin: Positive for color change.  Neurological: Negative for weakness.  Hematological: Negative for adenopathy.    HISTORY: Past Medical, Surgical, Social, and Family History Reviewed & Updated per EMR.   Pertinent Historical Findings include:  Past Medical History:  Diagnosis Date  . Asthma   . Ovarian torsion     No past surgical history on file.  Allergies  Allergen Reactions  . Fish-Derived Products Swelling  . Red Dye Hives  . Sulfa Antibiotics     Family History  Problem Relation Age of Onset  . Hyperlipidemia Mother   . Hypertension Mother   . Sudden death Neg Hx   . Heart attack Neg Hx   . Diabetes Neg Hx      Social History   Socioeconomic History  . Marital status: Single    Spouse name: Not on  file  . Number of children: Not on file  . Years of education: Not on file  . Highest education level: Not on file  Occupational History  . Not on file  Social Needs  . Financial resource strain: Not on file  . Food insecurity    Worry: Not on file    Inability: Not on file  . Transportation needs    Medical: Not on file    Non-medical: Not on file  Tobacco Use  . Smoking status: Former Smoker    Packs/day: 0.50  . Smokeless tobacco: Never Used  Substance and Sexual Activity  . Alcohol use: No  . Drug use: Not Currently  . Sexual activity: Not on file  Lifestyle  . Physical activity    Days per week: Not on file    Minutes per session: Not on file  . Stress: Not on file  Relationships  . Social Herbalist on phone: Not on file    Gets together: Not on file    Attends religious service: Not on file    Active member of club or organization: Not on file    Attends meetings of clubs or organizations: Not on file    Relationship status: Not on file  . Intimate partner violence    Fear of current or ex partner: Not on file  Emotionally abused: Not on file    Physically abused: Not on file    Forced sexual activity: Not on file  Other Topics Concern  . Not on file  Social History Narrative  . Not on file     PHYSICAL EXAM:  VS: BP 112/78   Pulse 80   Ht 5\' 3"  (1.6 m)   Wt 145 lb (65.8 kg)   LMP 12/08/2018   BMI 25.69 kg/m  Physical Exam Gen: NAD, alert, cooperative with exam, well-appearing ENT: normal lips, normal nasal mucosa,  Eye: normal EOM, normal conjunctiva and lids CV:  no edema, +2 pedal pulses   Resp: no accessory muscle use, non-labored,   Skin: no rashes, no areas of induration  Neuro: normal tone, normal sensation to touch Psych:  normal insight, alert and oriented MSK:  Right hand/wrist: Tenderness to palpation over the [place] form. Redness over the [facility] form with no streaking. Normal wrist flexion. Significant pain with wrist  extension. No tenderness to palpation of the TFCC. Normal strength resistance with finger abduction abduction. Normal grip strength. Neurovascular intact   Limited ultrasound: Right wrist:  Normal-appearing distal ulna. Normal-appearing ECU and insertion into the base of the fifth. Normal-appearing base of the fifth metacarpal and metacarpal shaft. [place] form demonstrates no changes or increased hyperemia.  This area was most tender to the touch without any changes in the FCU  Summary: no changes on [facility] at the point of most tender  Ultrasound and interpretation by [place], MD   ASSESSMENT & PLAN:   Right wrist pain No inciting event or trauma.  Does have family members with autoimmune diseases.  Suspect that is more of the case.  No fracture appreciated under ultrasound. -Prednisone. -Can continue the brace. -Provided with work note. -We will follow-up in 2 weeks.  Would consider a rheumatologic work-up at that point if had improvement with the prednisone.  May need to consider an MRI.

## 2018-12-23 ENCOUNTER — Ambulatory Visit: Payer: 59 | Admitting: Family Medicine

## 2018-12-23 ENCOUNTER — Encounter: Payer: Self-pay | Admitting: Family Medicine

## 2018-12-23 ENCOUNTER — Other Ambulatory Visit: Payer: Self-pay

## 2018-12-23 ENCOUNTER — Ambulatory Visit: Payer: Self-pay

## 2018-12-23 ENCOUNTER — Ambulatory Visit (INDEPENDENT_AMBULATORY_CARE_PROVIDER_SITE_OTHER): Payer: 59 | Admitting: Family Medicine

## 2018-12-23 VITALS — BP 112/78 | HR 80 | Ht 63.0 in | Wt 145.0 lb

## 2018-12-23 DIAGNOSIS — M25531 Pain in right wrist: Secondary | ICD-10-CM

## 2018-12-23 MED ORDER — PREDNISONE 5 MG PO TABS: ORAL_TABLET | ORAL | 0 refills | Status: DC

## 2018-12-23 NOTE — Patient Instructions (Signed)
Nice to meet you Please try the medicine  Please try ice on the area  Please continue the brace if it helps  Please send me a message in [place] with any questions or updates.  Please see me back in 2 weeks.   --Dr. Raeford Razor

## 2018-12-23 NOTE — Assessment & Plan Note (Signed)
No inciting event or trauma.  Does have family members with autoimmune diseases.  Suspect that is more of the case.  No fracture appreciated under ultrasound. -Prednisone. -Can continue the brace. -Provided with work note. -We will follow-up in 2 weeks.  Would consider a rheumatologic work-up at that point if had improvement with the prednisone.  May need to consider an MRI.

## 2019-01-05 ENCOUNTER — Encounter (HOSPITAL_BASED_OUTPATIENT_CLINIC_OR_DEPARTMENT_OTHER): Payer: Self-pay

## 2019-01-05 ENCOUNTER — Other Ambulatory Visit: Payer: Self-pay

## 2019-01-05 ENCOUNTER — Emergency Department (HOSPITAL_BASED_OUTPATIENT_CLINIC_OR_DEPARTMENT_OTHER)
Admission: EM | Admit: 2019-01-05 | Discharge: 2019-01-05 | Disposition: A | Discharge status: Home / Self Care | Payer: 59 | Attending: Emergency Medicine | Admitting: Emergency Medicine

## 2019-01-05 ENCOUNTER — Emergency Department (HOSPITAL_BASED_OUTPATIENT_CLINIC_OR_DEPARTMENT_OTHER): Payer: 59

## 2019-01-05 DIAGNOSIS — S99922A Unspecified injury of left foot, initial encounter: Secondary | ICD-10-CM | POA: Diagnosis present

## 2019-01-05 DIAGNOSIS — Y999 Unspecified external cause status: Secondary | ICD-10-CM | POA: Insufficient documentation

## 2019-01-05 DIAGNOSIS — Z87891 Personal history of nicotine dependence: Secondary | ICD-10-CM | POA: Diagnosis not present

## 2019-01-05 DIAGNOSIS — Y929 Unspecified place or not applicable: Secondary | ICD-10-CM | POA: Insufficient documentation

## 2019-01-05 DIAGNOSIS — Y9302 Activity, running: Secondary | ICD-10-CM | POA: Insufficient documentation

## 2019-01-05 DIAGNOSIS — J45909 Unspecified asthma, uncomplicated: Secondary | ICD-10-CM | POA: Insufficient documentation

## 2019-01-05 DIAGNOSIS — Y33XXXA Other specified events, undetermined intent, initial encounter: Secondary | ICD-10-CM | POA: Insufficient documentation

## 2019-01-05 MED ORDER — IBUPROFEN 600 MG PO TABS: 600.0000 mg | ORAL_TABLET | Freq: Four times a day (QID) | ORAL | 0 refills | Status: AC | PRN

## 2019-01-05 NOTE — ED Provider Notes (Signed)
[place] EMERGENCY DEPARTMENT Provider Note   CSN: [id number] Arrival date & time: 01/05/19  1526     History   Chief Complaint Chief Complaint  Patient presents with  . Foot Injury    HPI Annette Middleton is a 23 y.o. female with history of asthma presents for evaluation of acute onset, intermittent left foot pain secondary to injury yesterday evening.  She reports that while running in boots outside in gravel after it drained she thinks that her right foot may have slipped resulting in injury, denies fall, loss of consciousness, or head injury.  She notes sharp pains that occur primarily with weightbearing and palpation to the dorsum of the left foot.  Pain does not radiate.  Denies numbness or weakness.  No fevers.  Has been elevating the extremity and applying ice with some relief.  She sees orthopedics upstairs.     The history is provided by the patient.    Past Medical History:  Diagnosis Date  . Asthma   . Ovarian torsion     Patient Active Problem List   Diagnosis Date Noted  . Right wrist pain 12/23/2018  . Right knee pain 06/26/2011    History reviewed. No pertinent surgical history.   OB History   No obstetric history on file.      Home Medications    Prior to Admission medications   Medication Sig Start Date End Date Taking? Authorizing Provider  albuterol (PROVENTIL) (2.5 MG/3ML) 0.083% nebulizer solution Take 2.5 mg by nebulization every 6 (six) hours as needed. Shortness of breath    [provider]  famotidine (PEPCID) 20 MG tablet Take 1 tablet (20 mg total) by mouth 2 (two) times daily. 03/30/15   Palumbo, April, MD  ibuprofen (ADVIL) 600 MG tablet Take 1 tablet (600 mg total) by mouth every 6 (six) hours as needed. 01/05/19   Rodell Perna A, PA-C  levonorgestrel-ethinyl estradiol (AVIANE,ALESSE,LESSINA) 0.1-20 MG-MCG tablet Take 1 tablet by mouth daily.    [provider]  ondansetron (ZOFRAN ODT) 4 MG disintegrating  tablet Take 1 tablet (4 mg total) by mouth every 8 (eight) hours as needed. 10/07/18   Isla Pence, MD  predniSONE (DELTASONE) 5 MG tablet Take 6 pills for first day, 5 pills second day, 4 pills third day, 3 pills fourth day, 2 pills the fifth day, and 1 pill sixth day. 12/23/18   Rosemarie Ax, MD  Probiotic CAPS Take 1 capsule by mouth daily. 05/17/18   Ward, Delice Bison, DO  traMADol (ULTRAM) 50 MG tablet Take 1 tablet (50 mg total) by mouth every 6 (six) hours as needed. 03/11/18   Veryl Speak, MD    Family History Family History  Problem Relation Age of Onset  . Hyperlipidemia Mother   . Hypertension Mother   . Sudden death Neg Hx   . Heart attack Neg Hx   . Diabetes Neg Hx     Social History Social History   Tobacco Use  . Smoking status: Former Smoker    Packs/day: 0.50  . Smokeless tobacco: Never Used  Substance Use Topics  . Alcohol use: No  . Drug use: Not Currently     Allergies   Fish-derived products, Red dye, and Sulfa antibiotics   Review of Systems Review of Systems  Constitutional: Negative for chills and fever.  Musculoskeletal: Positive for arthralgias.  Neurological: Negative for weakness and numbness.  All other systems reviewed and are negative.    Physical Exam Updated Vital  Signs BP 113/81 (BP Location: Left Arm)   Pulse 80   Temp 98.8 F (37.1 C)   Resp 16   LMP 12/08/2018   SpO2 100%   Physical Exam Vitals signs and nursing note reviewed.  Constitutional:      General: She is not in acute distress.    Appearance: She is well-developed.  HENT:     Head: Normocephalic and atraumatic.  Eyes:     General:        Right eye: No discharge.        Left eye: No discharge.     Conjunctiva/sclera: Conjunctivae normal.  Neck:     Vascular: No JVD.     Trachea: No tracheal deviation.  Cardiovascular:     Rate and Rhythm: Normal rate.     Pulses: Normal pulses.     Comments: 2+ DP/PT pulses on the left.  Compartments are soft.   Denna Haggard' sign absent on the left. Pulmonary:     Effort: Pulmonary effort is normal.  Abdominal:     General: There is no distension.  Musculoskeletal:        General: Tenderness present.     Comments: Tenderness to palpation of the dorsum of the left foot between the first and second metatarsals.  No ecchymosis, crepitus, or swelling noted.  5/5 strength of the left foot with plantarflexion, dorsiflexion, inversion, and eversion against resistance as well as EHL strength.  Skin:    General: Skin is warm and dry.     Findings: No erythema.  Neurological:     Mental Status: She is alert.     Comments: Sensation intact to light touch of the left foot.  Ambulatory with antalgic gait but exhibits good balance.  Has crutches at the bedside.  Psychiatric:        Behavior: Behavior normal.      ED Treatments / Results  Labs (all labs ordered are listed, but only abnormal results are displayed) Labs Reviewed - No data to display  EKG None  Radiology Dg Foot Complete Left  Result Date: 01/05/2019 CLINICAL DATA:  LT foot pain s/p fall x today. Pt has pain with swelling overlying 1-3rd MT heads. HX: possible old injuries but unsure. EXAM: LEFT FOOT - COMPLETE 3+ VIEW COMPARISON:  None. FINDINGS: There is no evidence of fracture or dislocation. There is no evidence of arthropathy or other focal bone abnormality. Soft tissues are unremarkable. IMPRESSION: Negative left foot radiographs. Electronically Signed   By: Emmaline Kluver M.D.   On: 01/05/2019 16:24    Procedures Procedures (including critical care time)  Medications Ordered in ED Medications - No data to display   Initial Impression / Assessment and Plan / ED Course  I have reviewed the triage vital signs and the nursing notes.  Pertinent labs & imaging results that were available during my care of the patient were reviewed by me and considered in my medical decision making (see chart for details).        Patient  presenting for evaluation of left foot pain after injury last night.  She is afebrile, vital signs are stable.  She is nontoxic in appearance.  She is neurovascularly intact, ambulatory despite pain.  She has crutches at the bedside.  Radiographs performed today show no evidence of acute osseous abnormality, fracture or dislocation.  Compartments are soft.  Conservative therapy indicated and discussed with patient.  She already has a sports medicine doctor with whom she can follow-up.  Discussed strict  ED return precautions. Patient verbalized understanding of and agreement with plan and is safe for discharge home at this time.   Final Clinical Impressions(s) / ED Diagnoses   Final diagnoses:  Injury of left foot, initial encounter    ED Discharge Orders         Ordered    ibuprofen (ADVIL) 600 MG tablet  Every 6 hours PRN     01/05/19 1649           Jeanie SewerFawze, Angelette Ganus A, PA-C 01/05/19 1653    Alvira MondaySchlossman, Erin, MD 01/06/19 1259

## 2019-01-05 NOTE — ED Triage Notes (Signed)
Pt c/o left foot injury while running last night-NAD-to triage with crutches

## 2019-01-05 NOTE — Discharge Instructions (Signed)
1. Medications: Alternate 600 mg of ibuprofen and [phone] mg of Tylenol every 3 hours as needed for pain. Do not exceed 4000 mg of Tylenol daily.  Take ibuprofen with food to avoid upset stomach issues.  2. Treatment: rest, ice, elevate and use brace, drink plenty of fluids, gentle stretching 3. Follow Up: Please followup with your orthopedist as directed or your PCP in 1 week if no improvement for discussion of your diagnoses and further evaluation after today's visit; Please return to the ER for worsening symptoms or other concerns such as worsening swelling, redness of the skin, fevers, loss of pulses, or loss of feeling

## 2019-01-05 NOTE — ED Notes (Signed)
inj to top of left foot while running  Ice applied

## 2019-01-06 ENCOUNTER — Ambulatory Visit: Payer: 59 | Admitting: Family Medicine

## 2019-02-02 ENCOUNTER — Encounter (HOSPITAL_BASED_OUTPATIENT_CLINIC_OR_DEPARTMENT_OTHER): Payer: Self-pay

## 2019-02-02 ENCOUNTER — Emergency Department (HOSPITAL_BASED_OUTPATIENT_CLINIC_OR_DEPARTMENT_OTHER)
Admission: EM | Admit: 2019-02-02 | Discharge: 2019-02-02 | Disposition: A | Discharge status: Home / Self Care | Payer: 59 | Attending: Emergency Medicine | Admitting: Emergency Medicine

## 2019-02-02 ENCOUNTER — Other Ambulatory Visit: Payer: Self-pay

## 2019-02-02 DIAGNOSIS — Z5321 Procedure and treatment not carried out due to patient leaving prior to being seen by health care provider: Secondary | ICD-10-CM | POA: Insufficient documentation

## 2019-02-02 DIAGNOSIS — R519 Headache, unspecified: Secondary | ICD-10-CM | POA: Diagnosis present

## 2019-02-02 NOTE — ED Triage Notes (Addendum)
Pt c/o HA x 5 days-denies injury-denies fever/flu like sx-NAD-steady gait

## 2019-03-24 ENCOUNTER — Other Ambulatory Visit: Payer: Self-pay

## 2019-03-24 ENCOUNTER — Encounter: Payer: Self-pay | Admitting: Family Medicine

## 2019-03-24 ENCOUNTER — Ambulatory Visit: Payer: 59 | Admitting: Family Medicine

## 2019-03-24 VITALS — BP 98/63 | HR 81 | Ht 62.0 in | Wt 144.0 lb

## 2019-03-24 DIAGNOSIS — M25531 Pain in right wrist: Secondary | ICD-10-CM | POA: Diagnosis not present

## 2019-03-24 MED ORDER — PREDNISONE 5 MG PO TABS: ORAL_TABLET | ORAL | 0 refills | Status: AC

## 2019-03-24 NOTE — Progress Notes (Signed)
[place] - 24 y.o. female MRN [id number]  Date of birth: [birth year]  SUBJECTIVE:  Including CC & ROS.  Chief Complaint  Patient presents with  . Follow-up    follow up for right wrist    Lashaun Erich is a 24 y.o. female that is presenting with acute worsening of the right wrist.  The pain is been ongoing since October.  It is more constant and severe.  Seems to be localized to the palmar aspect over the pisiform.  Has swelling over this area.  No significant redness.  She does lack of range of motion in flexion and extension.  Is exacerbated with any pressure.  She is unable to lift without significant pain.  She denies any specific inciting event.  Has no history of similar pain.  Seems to be localized just to the wrist.   Review of Systems See HPI   HISTORY: Past Medical, Surgical, Social, and Family History Reviewed & Updated per EMR.   Pertinent Historical Findings include:  Past Medical History:  Diagnosis Date  . Asthma   . Ovarian torsion     No past surgical history on file.  Allergies  Allergen Reactions  . Fish-Derived Products Swelling  . Red Dye Hives  . Sulfa Antibiotics     Family History  Problem Relation Age of Onset  . Hyperlipidemia Mother   . Hypertension Mother   . Sudden death Neg Hx   . Heart attack Neg Hx   . Diabetes Neg Hx      Social History   Socioeconomic History  . Marital status: Single    Spouse name: Not on file  . Number of children: Not on file  . Years of education: Not on file  . Highest education level: Not on file  Occupational History  . Not on file  Tobacco Use  . Smoking status: Former Smoker    Packs/day: 0.50  . Smokeless tobacco: Never Used  Substance and Sexual Activity  . Alcohol use: Yes    Comment: occ  . Drug use: Not Currently  . Sexual activity: Not on file  Other Topics Concern  . Not on file  Social History Narrative  . Not on file   Social Determinants of Health   Financial Resource  Strain:   . Difficulty of Paying Living Expenses: Not on file  Food Insecurity:   . Worried About Charity fundraiser in the Last Year: Not on file  . Ran Out of Food in the Last Year: Not on file  Transportation Needs:   . Lack of Transportation (Medical): Not on file  . Lack of Transportation (Non-Medical): Not on file  Physical Activity:   . Days of Exercise per Week: Not on file  . Minutes of Exercise per Session: Not on file  Stress:   . Feeling of Stress : Not on file  Social Connections:   . Frequency of Communication with Friends and Family: Not on file  . Frequency of Social Gatherings with Friends and Family: Not on file  . Attends Religious Services: Not on file  . Active Member of Clubs or Organizations: Not on file  . Attends Archivist Meetings: Not on file  . Marital Status: Not on file  Intimate Partner Violence:   . Fear of Current or Ex-Partner: Not on file  . Emotionally Abused: Not on file  . Physically Abused: Not on file  . Sexually Abused: Not on file     PHYSICAL EXAM:  VS: BP 98/63   Pulse 81   Ht 5\' 2"  (1.575 m)   Wt 144 lb (65.3 kg)   BMI 26.34 kg/m  Physical Exam Gen: NAD, alert, cooperative with exam, well-appearing ENT: normal lips, normal nasal mucosa,  Eye: normal EOM, normal conjunctiva and lids Skin: no rashes, no areas of induration  Neuro: normal tone, normal sensation to touch Psych:  normal insight, alert and oriented MSK:  Right wrist/hand:  Swelling appreciated over the palmar aspect of the pisiform. Limited flexion and extension when compared to contralateral side. Normal ulnar and radial deviation. Tenderness to palpation over the piriform Normal thumb range of motion. No finger malrotation or misalignment. Neurovascularly intact    ASSESSMENT & PLAN:   Right wrist pain Pain is been ongoing with no specific trigger.  Has some inflammatory and autoimmune that runs in the family.  Seems more likely than an  occult fracture. -Prednisone. -Lab work. -If no improvement may need to evaluate with an MRI to check for an occult fracture.

## 2019-03-24 NOTE — Patient Instructions (Signed)
Good to see you Please try the prednisone  I will call with the results   Please send me a message in MyChart with any questions or updates.  Follow up with depend on lab work and if we need to order an MRI.   --Dr. Jordan Likes

## 2019-03-24 NOTE — Assessment & Plan Note (Signed)
Pain is been ongoing with no specific trigger.  Has some inflammatory and autoimmune that runs in the family.  Seems more likely than an occult fracture. -Prednisone. -Lab work. -If no improvement may need to evaluate with an MRI to check for an occult fracture.

## 2019-03-26 LAB — C-REACTIVE PROTEIN: CRP: 7 mg/L (ref 0–10)

## 2019-03-26 LAB — SEDIMENTATION RATE: Sed Rate: 26 mm/hr (ref 0–32)

## 2019-03-26 LAB — ANA,IFA RA DIAG PNL W/RFLX TIT/PATN
ANA Titer 1: NEGATIVE
Cyclic Citrullin Peptide Ab: 5 units (ref 0–19)
Rheumatoid fact SerPl-aCnc: <10 IU/mL (ref 0.0–13.9)

## 2019-03-31 ENCOUNTER — Telehealth: Payer: Self-pay | Admitting: Family Medicine

## 2019-03-31 ENCOUNTER — Other Ambulatory Visit: Payer: Self-pay | Admitting: Family Medicine

## 2019-03-31 DIAGNOSIS — M25531 Pain in right wrist: Secondary | ICD-10-CM

## 2019-03-31 NOTE — Telephone Encounter (Signed)
Left VM for patient. If she calls back please have her speak with a nurse/CMA and inform that her labs were normal. Find out if the prednisone helped. If no better we may need to consider an MRI.   If any questions then please take the best time and phone number to call and I will try to call her back.   Myra Rude, MD [facility] 03/31/2019, 8:58 AM

## 2019-03-31 NOTE — Telephone Encounter (Signed)
Spoke to patient and gave her result information as provided by the physician. 

## 2019-03-31 NOTE — Telephone Encounter (Signed)
Patient returning call for results 

## 2019-04-15 NOTE — Addendum Note (Signed)
Addended by: Kathi Simpers F on: 04/15/2019 04:03 PM   Modules accepted: Orders

## 2020-05-11 ENCOUNTER — Encounter (HOSPITAL_BASED_OUTPATIENT_CLINIC_OR_DEPARTMENT_OTHER): Payer: Self-pay

## 2020-05-11 ENCOUNTER — Other Ambulatory Visit: Payer: Self-pay

## 2020-05-11 ENCOUNTER — Emergency Department (HOSPITAL_BASED_OUTPATIENT_CLINIC_OR_DEPARTMENT_OTHER)
Admission: EM | Admit: 2020-05-11 | Discharge: 2020-05-11 | Disposition: A | Discharge status: Home / Self Care | Payer: 59 | Attending: Emergency Medicine | Admitting: Emergency Medicine

## 2020-05-11 DIAGNOSIS — J45909 Unspecified asthma, uncomplicated: Secondary | ICD-10-CM | POA: Insufficient documentation

## 2020-05-11 DIAGNOSIS — Z87891 Personal history of nicotine dependence: Secondary | ICD-10-CM | POA: Insufficient documentation

## 2020-05-11 DIAGNOSIS — Z20822 Contact with and (suspected) exposure to covid-19: Secondary | ICD-10-CM | POA: Insufficient documentation

## 2020-05-11 DIAGNOSIS — J029 Acute pharyngitis, unspecified: Secondary | ICD-10-CM | POA: Insufficient documentation

## 2020-05-11 LAB — GROUP A STREP BY PCR: Group A Strep by PCR: NOT DETECTED

## 2020-05-11 NOTE — ED Provider Notes (Signed)
[facility] EMERGENCY DEPARTMENT Provider Note   CSN: [id number] Arrival date & time: 05/11/20  1931     History Chief Complaint  Patient presents with  . Sore Throat    Annette Middleton is a 25 y.o. female past medical history of asthma, presenting emergency department complaint of 2 days of sore throat.  She states she is developing worsening pain with swallowing therefore presents to the ED for evaluation.  She denies fevers or chills.  Denies rhinorrhea or congestion.  Denies ear pain.  Denies cough.  No voice changes or difficulty swallowing liquids.  History of tonsillectomy.  Has not treated her symptoms any medications.  The history is provided by the patient.       Past Medical History:  Diagnosis Date  . Asthma   . Ovarian torsion     Patient Active Problem List   Diagnosis Date Noted  . Right wrist pain 12/23/2018  . Right knee pain 06/26/2011    History reviewed. No pertinent surgical history.   OB History   No obstetric history on file.     Family History  Problem Relation Age of Onset  . Hyperlipidemia Mother   . Hypertension Mother   . Sudden death Neg Hx   . Heart attack Neg Hx   . Diabetes Neg Hx     Social History   Tobacco Use  . Smoking status: Former Smoker    Packs/day: 0.50  . Smokeless tobacco: Never Used  Vaping Use  . Vaping Use: Every day  Substance Use Topics  . Alcohol use: Yes    Comment: occ  . Drug use: Not Currently    Home Medications Prior to Admission medications   Medication Sig Start Date End Date Taking? Authorizing Provider  albuterol (PROVENTIL) (2.5 MG/3ML) 0.083% nebulizer solution Take 2.5 mg by nebulization every 6 (six) hours as needed. Shortness of breath    [provider]  famotidine (PEPCID) 20 MG tablet Take 1 tablet (20 mg total) by mouth 2 (two) times daily. 03/30/15   Palumbo, April, MD  ibuprofen (ADVIL) 600 MG tablet Take 1 tablet (600 mg total) by mouth every 6 (six) hours as  needed. 01/05/19   Michela Pitcher A, PA-C  levonorgestrel-ethinyl estradiol (AVIANE,ALESSE,LESSINA) 0.1-20 MG-MCG tablet Take 1 tablet by mouth daily.    [provider]  ondansetron (ZOFRAN ODT) 4 MG disintegrating tablet Take 1 tablet (4 mg total) by mouth every 8 (eight) hours as needed. 10/07/18   Jacalyn Lefevre, MD  predniSONE (DELTASONE) 5 MG tablet Take 6 pills for first day, 5 pills second day, 4 pills third day, 3 pills fourth day, 2 pills the fifth day, and 1 pill sixth day. 03/24/19   Myra Rude, MD  Probiotic CAPS Take 1 capsule by mouth daily. 05/17/18   Ward, Layla Maw, DO  traMADol (ULTRAM) 50 MG tablet Take 1 tablet (50 mg total) by mouth every 6 (six) hours as needed. 03/11/18   Geoffery Lyons, MD    Allergies    Fish-derived products, Red dye, and Sulfa antibiotics  Review of Systems   Review of Systems  HENT: Positive for sore throat.   All other systems reviewed and are negative.   Physical Exam Updated Vital Signs BP 135/85 (BP Location: Right Arm)   Pulse 83   Temp 99 F (37.2 C) (Oral)   Resp 18   Ht 5' 2.5" (1.588 m)   Wt 72.6 kg   SpO2 100%   BMI 28.80  kg/m   Physical Exam Vitals and nursing note reviewed.  Constitutional:      Appearance: She is well-developed and well-nourished.  HENT:     Head: Normocephalic and atraumatic.     Right Ear: Tympanic membrane and ear canal normal.     Left Ear: Tympanic membrane and ear canal normal.     Mouth/Throat:     Pharynx: Uvula midline. Posterior oropharyngeal erythema present. No oropharyngeal exudate.     Comments: Tolerating secretions.  Uvula is midline, no trismus.  Mild posterior oropharyngeal erythema.  No exudates.   Eyes:     Conjunctiva/sclera: Conjunctivae normal.  Cardiovascular:     Rate and Rhythm: Normal rate and regular rhythm.  Pulmonary:     Effort: Pulmonary effort is normal. No respiratory distress.     Breath sounds: Normal breath sounds.  Abdominal:     Palpations:  Abdomen is soft.  Musculoskeletal:     Cervical back: Neck supple. No tenderness.  Lymphadenopathy:     Cervical: No cervical adenopathy.  Skin:    General: Skin is warm.  Neurological:     Mental Status: She is alert.  Psychiatric:        Mood and Affect: Mood and affect normal.        Behavior: Behavior normal.     ED Results / Procedures / Treatments   Labs (all labs ordered are listed, but only abnormal results are displayed) Labs Reviewed  GROUP A STREP BY PCR  SARS CORONAVIRUS 2 (TAT 6-24 HRS)    EKG None  Radiology No results found.  Procedures Procedures   Medications Ordered in ED Medications - No data to display  ED Course  I have reviewed the triage vital signs and the nursing notes.  Pertinent labs & imaging results that were available during my care of the patient were reviewed by me and considered in my medical decision making (see chart for details).    MDM Rules/Calculators/A&P                         Patients symptoms are consistent with pharyngitis, likely viral etiology.  Strep swab was negative.  Covid swab sent and pending.  Patient is afebrile, tolerating secretions. Presentation not concerning for PTA or infxn spread to soft tissue. No difficulty breathing or swallowing. No trismus or uvula deviation. Discussed that antibiotics are not indicated for viral infections. Pt will be discharged with symptomatic treatment. Strict return precautions given. Pt is hemodynamically stable and in NAD prior to discharge.  Discussed results, findings, treatment and follow up. Patient advised of return precautions. Patient verbalized understanding and agreed with plan.  Final Clinical Impression(s) / ED Diagnoses Final diagnoses:  Viral pharyngitis    Rx / DC Orders ED Discharge Orders    None       Quaran Kedzierski, [place] N, PA-C 05/11/20 2247    Maia Plan, MD 05/19/20 1013

## 2020-05-11 NOTE — Discharge Instructions (Addendum)
You can take tylenol or ibuprofen as needed for sore throat or fever.  Drink plenty of water.  Use saline nasal spray for congestion. Follow up with your primary care provider.  Return to the ER for inability to swallow liquids, difficulty breathing, or new or worsening symptoms.

## 2020-05-11 NOTE — ED Triage Notes (Signed)
Pt c/o sore throat on left side x 2 days. States has been worsening and becoming hard to breathe/swallow. NAD during triage. States has noticed swelling to left neck/under jaw.

## 2020-05-12 LAB — SARS CORONAVIRUS 2 (TAT 6-24 HRS): SARS Coronavirus 2: NEGATIVE

## 2020-11-12 IMAGING — DX RIGHT FOOT COMPLETE - 3+ VIEW
3 series · 3 of 3 positions shown · non-contrast
Comparison: None.

CLINICAL DATA: Initial evaluation for acute injury, fall. Bruising
to fifth metatarsal.

EXAM:
RIGHT FOOT COMPLETE - 3+ VIEW

[foot ap]
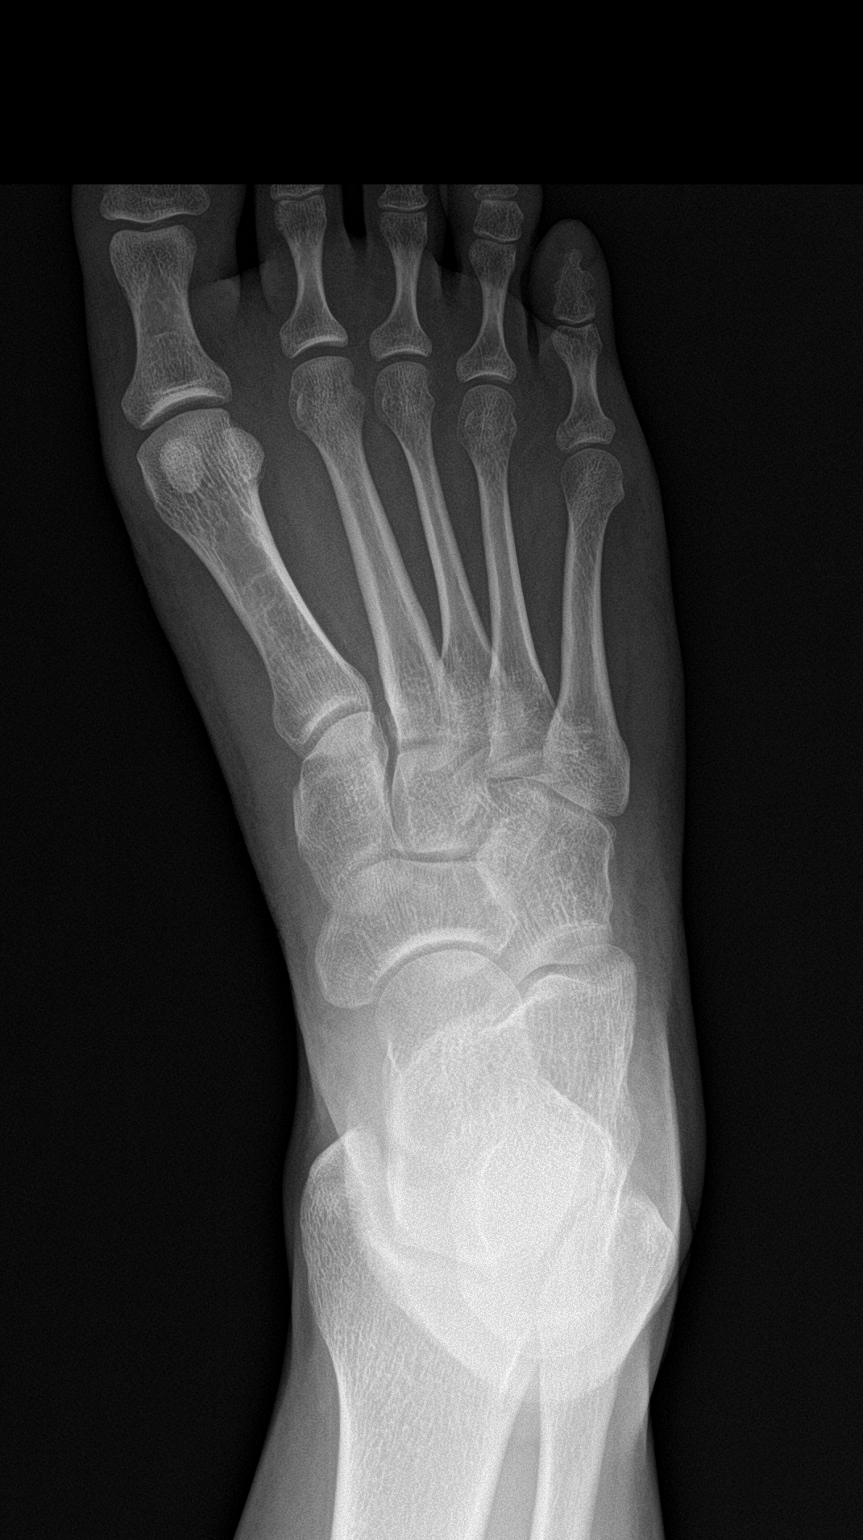

[foot obl]
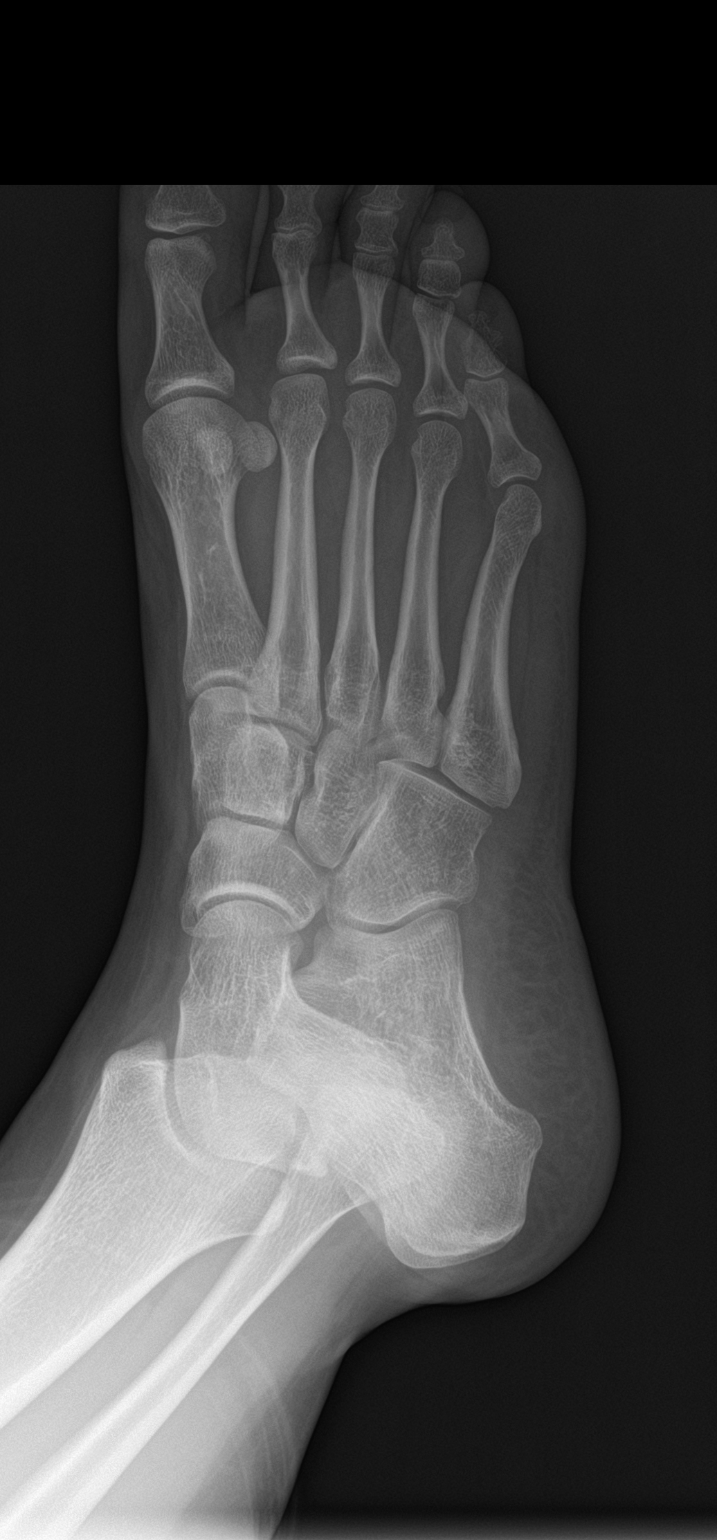

[foot lat]
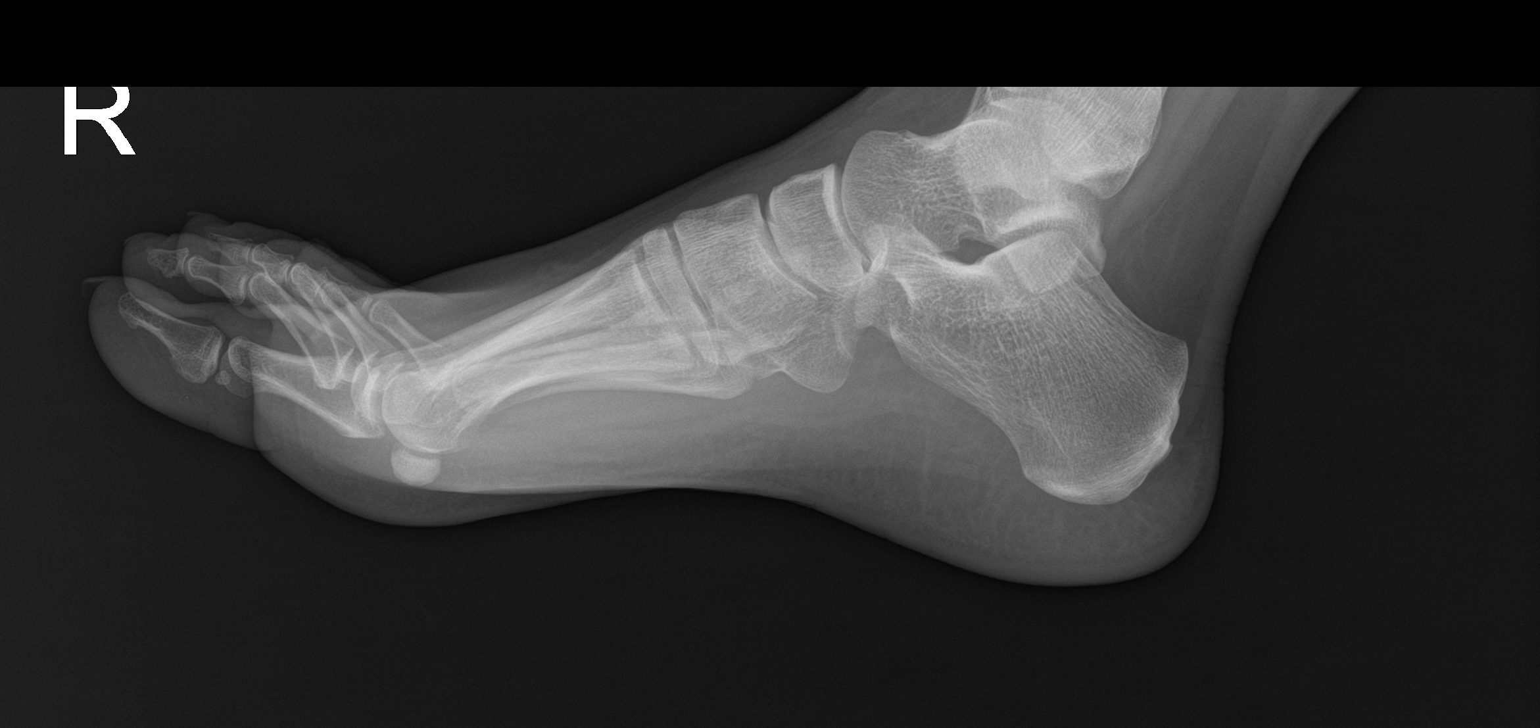

[3 of 3 positions shown; findings below may reference images not displayed]

FINDINGS: There is no evidence of fracture or dislocation. There is no
evidence of arthropathy or other focal bone abnormality. Soft
tissues are unremarkable.
IMPRESSION: No acute osseous abnormality about the right foot.

## 2020-12-20 ENCOUNTER — Other Ambulatory Visit: Payer: Self-pay

## 2020-12-20 ENCOUNTER — Emergency Department (HOSPITAL_BASED_OUTPATIENT_CLINIC_OR_DEPARTMENT_OTHER)
Admission: EM | Admit: 2020-12-20 | Discharge: 2020-12-20 | Disposition: A | Discharge status: Home / Self Care | Payer: 59 | Attending: Emergency Medicine | Admitting: Emergency Medicine

## 2020-12-20 ENCOUNTER — Encounter (HOSPITAL_BASED_OUTPATIENT_CLINIC_OR_DEPARTMENT_OTHER): Payer: Self-pay

## 2020-12-20 DIAGNOSIS — J0111 Acute recurrent frontal sinusitis: Secondary | ICD-10-CM | POA: Insufficient documentation

## 2020-12-20 DIAGNOSIS — Z87891 Personal history of nicotine dependence: Secondary | ICD-10-CM | POA: Insufficient documentation

## 2020-12-20 DIAGNOSIS — J0110 Acute frontal sinusitis, unspecified: Secondary | ICD-10-CM

## 2020-12-20 DIAGNOSIS — J45909 Unspecified asthma, uncomplicated: Secondary | ICD-10-CM | POA: Insufficient documentation

## 2020-12-20 DIAGNOSIS — J3489 Other specified disorders of nose and nasal sinuses: Secondary | ICD-10-CM | POA: Insufficient documentation

## 2020-12-20 DIAGNOSIS — J029 Acute pharyngitis, unspecified: Secondary | ICD-10-CM | POA: Insufficient documentation

## 2020-12-20 MED ORDER — AMOXICILLIN-POT CLAVULANATE 875-125 MG PO TABS: 1.0000 | ORAL_TABLET | Freq: Two times a day (BID) | ORAL | 0 refills | Status: AC

## 2020-12-20 MED ORDER — AMOXICILLIN-POT CLAVULANATE 875-125 MG PO TABS: 1.0000 | ORAL_TABLET | Freq: Two times a day (BID) | ORAL | Status: DC

## 2020-12-20 NOTE — ED Provider Notes (Signed)
[facility] EMERGENCY DEPARTMENT Provider Note   CSN: [id number] Arrival date & time: 12/20/20  1657     History Chief Complaint  Patient presents with   Cough    Annette Middleton is a 25 y.o. female.  Patient presents with two weeks of nasal congestion, headaches, and ear pain. She states that the symptoms have worsened since then. Nothing has made it better or worse. She has been increasingly fatigued as well. States that she started having a sore throat today, but attributes it to coughing. She has had a lingering cough for about two weeks as well. Denies any associated fevers or shortness of breath.    Cough Associated symptoms: ear pain, headaches, rhinorrhea and sore throat   Associated symptoms: no chest pain, no chills, no fever, no rash and no shortness of breath       Past Medical History:  Diagnosis Date   Asthma    Ovarian torsion     Patient Active Problem List   Diagnosis Date Noted   Right wrist pain 12/23/2018   Right knee pain 06/26/2011    History reviewed. No pertinent surgical history.   OB History   No obstetric history on file.     Family History  Problem Relation Age of Onset   Hyperlipidemia Mother    Hypertension Mother    Sudden death Neg Hx    Heart attack Neg Hx    Diabetes Neg Hx     Social History   Tobacco Use   Smoking status: Former    Packs/day: 0.50    Types: Cigarettes   Smokeless tobacco: Never  Vaping Use   Vaping Use: Every day  Substance Use Topics   Alcohol use: Yes    Comment: occ   Drug use: Not Currently    Home Medications Prior to Admission medications   Medication Sig Start Date End Date Taking? Authorizing Provider  amoxicillin-clavulanate (AUGMENTIN) 875-125 MG tablet Take 1 tablet by mouth 2 (two) times daily for 7 days. 12/20/20 12/27/20 Yes Paiton Fosco, Finis Bud, PA-C  albuterol (PROVENTIL) (2.5 MG/3ML) 0.083% nebulizer solution Take 2.5 mg by nebulization every 6 (six) hours as needed.  Shortness of breath    [provider]  famotidine (PEPCID) 20 MG tablet Take 1 tablet (20 mg total) by mouth 2 (two) times daily. 03/30/15   Palumbo, April, MD  ibuprofen (ADVIL) 600 MG tablet Take 1 tablet (600 mg total) by mouth every 6 (six) hours as needed. 01/05/19   Michela Pitcher A, PA-C  levonorgestrel-ethinyl estradiol (AVIANE,ALESSE,LESSINA) 0.1-20 MG-MCG tablet Take 1 tablet by mouth daily.    [provider]  ondansetron (ZOFRAN ODT) 4 MG disintegrating tablet Take 1 tablet (4 mg total) by mouth every 8 (eight) hours as needed. 10/07/18   Jacalyn Lefevre, MD  predniSONE (DELTASONE) 5 MG tablet Take 6 pills for first day, 5 pills second day, 4 pills third day, 3 pills fourth day, 2 pills the fifth day, and 1 pill sixth day. 03/24/19   Myra Rude, MD  Probiotic CAPS Take 1 capsule by mouth daily. 05/17/18   Ward, Layla Maw, DO  traMADol (ULTRAM) 50 MG tablet Take 1 tablet (50 mg total) by mouth every 6 (six) hours as needed. 03/11/18   Geoffery Lyons, MD    Allergies    Fish-derived products, Red dye, and Sulfa antibiotics  Review of Systems   Review of Systems  Constitutional:  Positive for fatigue. Negative for chills and fever.  HENT:  Positive for congestion, ear discharge, ear pain, rhinorrhea, sinus pressure, sinus pain, sore throat and voice change.   Eyes:  Negative for visual disturbance.  Respiratory:  Positive for cough. Negative for chest tightness and shortness of breath.   Cardiovascular:  Negative for chest pain, palpitations and leg swelling.  Gastrointestinal:  Negative for abdominal pain, blood in stool, constipation, diarrhea, nausea and vomiting.  Genitourinary:  Negative for dysuria, flank pain and hematuria.  Musculoskeletal:  Negative for back pain.  Skin:  Negative for rash and wound.  Neurological:  Positive for headaches. Negative for dizziness, syncope, weakness and light-headedness.  Psychiatric/Behavioral:  Negative for confusion.    All other systems reviewed and are negative.  Physical Exam Updated Vital Signs BP 128/68 (BP Location: Left Arm)   Pulse 74   Temp 98.9 F (37.2 C) (Oral)   Resp 18   Ht 5\' 2"  (1.575 m)   Wt 86.2 kg   SpO2 100%   BMI 34.75 kg/m   Physical Exam Vitals and nursing note reviewed.  Constitutional:      General: She is not in acute distress.    Appearance: Normal appearance. She is not ill-appearing, toxic-appearing or diaphoretic.  HENT:     Head: Normocephalic and atraumatic.     Right Ear: Tympanic membrane, ear canal and external ear normal. There is no impacted cerumen. Tympanic membrane is not erythematous, retracted or bulging.     Left Ear: Tympanic membrane, ear canal and external ear normal. There is no impacted cerumen. Tympanic membrane is not erythematous, retracted or bulging.     Nose: Congestion and rhinorrhea present. No nasal deformity.     Right Sinus: Frontal sinus tenderness present. No maxillary sinus tenderness.     Left Sinus: Frontal sinus tenderness present. No maxillary sinus tenderness.     Mouth/Throat:     Lips: Pink. No lesions.     Mouth: Mucous membranes are moist. No injury, lacerations, oral lesions or angioedema.     Pharynx: Oropharynx is clear. Uvula midline. No pharyngeal swelling, oropharyngeal exudate, posterior oropharyngeal erythema or uvula swelling.  Eyes:     General: Gaze aligned appropriately. No scleral icterus.       Right eye: No discharge.        Left eye: No discharge.     Conjunctiva/sclera: Conjunctivae normal.     Right eye: Right conjunctiva is not injected. No exudate or hemorrhage.    Left eye: Left conjunctiva is not injected. No exudate or hemorrhage. Cardiovascular:     Pulses:          Radial pulses are 2+ on the right side and 2+ on the left side.       Dorsalis pedis pulses are 2+ on the right side and 2+ on the left side.     Heart sounds: S1 normal and S2 normal. Heart sounds not distant.    No S3 or S4  sounds.  Pulmonary:     Effort: Pulmonary effort is normal. No accessory muscle usage or respiratory distress.     Breath sounds: Normal breath sounds. No stridor. No wheezing, rhonchi or rales.  Chest:     Chest wall: No tenderness.  Abdominal:     Palpations: There is no pulsatile mass.  Lymphadenopathy:     Cervical: No cervical adenopathy.  Skin:    General: Skin is warm and dry.     Coloration: Skin is not jaundiced or pale.     Findings: No bruising, erythema, lesion  or rash.  Neurological:     General: No focal deficit present.     Mental Status: She is alert and oriented to person, place, and time.     GCS: GCS eye subscore is 4. GCS verbal subscore is 5. GCS motor subscore is 6.  Psychiatric:        Mood and Affect: Mood normal.        Behavior: Behavior normal. Behavior is cooperative.    ED Results / Procedures / Treatments   Labs (all labs ordered are listed, but only abnormal results are displayed) Labs Reviewed - No data to display  EKG None  Radiology No results found.  Procedures Procedures   Medications Ordered in ED Medications - No data to display  ED Course  I have reviewed the triage vital signs and the nursing notes.  Pertinent labs & imaging results that were available during my care of the patient were reviewed by me and considered in my medical decision making (see chart for details).    MDM Rules/Calculators/A&P                         Patient presents with 2 weeks of upper respiratory congestion, ear pain, headaches, and increasing fatigue. She is afebrile and vitals are stable. Exam with pressure fell on bilateral maxillary and frontal sinuses.  TMs were normal with no erythema or signs of infection. Patient declines COVID testing. I feel the patient's presentation is consistent with acute sinusitis infection. Prescribed Augmentin for 7 days.  Final Clinical Impression(s) / ED Diagnoses Final diagnoses:  Acute frontal sinusitis,  recurrence not specified    Rx / DC Orders ED Discharge Orders          Ordered    amoxicillin-clavulanate (AUGMENTIN) 875-125 MG tablet  2 times daily        12/20/20 1911             Therese Sarah 12/20/20 1918    Milagros Loll, MD 12/20/20 2033

## 2020-12-20 NOTE — ED Triage Notes (Addendum)
Pt c/o flu like sx x 2 weeks-states she came to ED today due to increase in earache-NAD-steady gait

## 2020-12-20 NOTE — Discharge Instructions (Signed)

## 2021-03-30 ENCOUNTER — Emergency Department (HOSPITAL_BASED_OUTPATIENT_CLINIC_OR_DEPARTMENT_OTHER): Payer: Self-pay

## 2021-03-30 ENCOUNTER — Emergency Department (HOSPITAL_BASED_OUTPATIENT_CLINIC_OR_DEPARTMENT_OTHER)
Admission: EM | Admit: 2021-03-30 | Discharge: 2021-03-30 | Disposition: A | Discharge status: Home / Self Care | Payer: Self-pay | Attending: Emergency Medicine | Admitting: Emergency Medicine

## 2021-03-30 ENCOUNTER — Encounter (HOSPITAL_BASED_OUTPATIENT_CLINIC_OR_DEPARTMENT_OTHER): Payer: Self-pay | Admitting: *Deleted

## 2021-03-30 ENCOUNTER — Other Ambulatory Visit: Payer: Self-pay

## 2021-03-30 DIAGNOSIS — S46911A Strain of unspecified muscle, fascia and tendon at shoulder and upper arm level, right arm, initial encounter: Secondary | ICD-10-CM | POA: Insufficient documentation

## 2021-03-30 DIAGNOSIS — X58XXXA Exposure to other specified factors, initial encounter: Secondary | ICD-10-CM | POA: Insufficient documentation

## 2021-03-30 MED ORDER — IBUPROFEN 400 MG PO TABS
600.0000 mg | ORAL_TABLET | Freq: Once | ORAL | Status: AC
  Administered 2021-03-30: 600 mg via ORAL
  Filled 2021-03-30: qty 1

## 2021-03-30 NOTE — Discharge Instructions (Addendum)
If you develop fever, weakness or numbness in the right arm, severe unrelenting pain, or any other new/concerning symptoms the return to the ER for evaluation.  Take ibuprofen and Tylenol for pain.

## 2021-03-30 NOTE — ED Triage Notes (Signed)
Right shoulder pain for a week. Limited motion. She felt at first she had an injury.

## 2021-03-30 NOTE — ED Provider Notes (Signed)
[facility] EMERGENCY DEPARTMENT Provider Note   CSN: [id number] Arrival date & time: 03/30/21  1853     History  Chief Complaint  Patient presents with   Shoulder Pain    Rosio Middleton is a 25 y.o. female.  HPI 26 year old female presents with right shoulder/clavicle pain.  She states she woke up with pain to her posterior right shoulder 6 days ago.  However today she woke up and the pain was a lot worse and seem to be more in her medial clavicle.  No trauma.  However she wonders if he slept on it wrong.  She can move her arm but it is very painful.  There is no weakness or numbness.  She has tried Tylenol which has partially helped.  Home Medications Prior to Admission medications   Medication Sig Start Date End Date Taking? Authorizing Provider  albuterol (PROVENTIL) (2.5 MG/3ML) 0.083% nebulizer solution Take 2.5 mg by nebulization every 6 (six) hours as needed. Shortness of breath    [provider]  famotidine (PEPCID) 20 MG tablet Take 1 tablet (20 mg total) by mouth 2 (two) times daily. 03/30/15   Palumbo, April, MD  ibuprofen (ADVIL) 600 MG tablet Take 1 tablet (600 mg total) by mouth every 6 (six) hours as needed. 01/05/19   Michela Pitcher A, PA-C  levonorgestrel-ethinyl estradiol (AVIANE,ALESSE,LESSINA) 0.1-20 MG-MCG tablet Take 1 tablet by mouth daily.    [provider]  ondansetron (ZOFRAN ODT) 4 MG disintegrating tablet Take 1 tablet (4 mg total) by mouth every 8 (eight) hours as needed. 10/07/18   Jacalyn Lefevre, MD  predniSONE (DELTASONE) 5 MG tablet Take 6 pills for first day, 5 pills second day, 4 pills third day, 3 pills fourth day, 2 pills the fifth day, and 1 pill sixth day. 03/24/19   Myra Rude, MD  Probiotic CAPS Take 1 capsule by mouth daily. 05/17/18   Ward, Layla Maw, DO  traMADol (ULTRAM) 50 MG tablet Take 1 tablet (50 mg total) by mouth every 6 (six) hours as needed. 03/11/18   Geoffery Lyons, MD      Allergies     Fish-derived products, Red dye, and Sulfa antibiotics    Review of Systems   Review of Systems  Musculoskeletal:  Positive for arthralgias.  Neurological:  Negative for weakness and numbness.   Physical Exam Updated Vital Signs BP 118/82    Pulse 79    Temp 98.3 F (36.8 C) (Oral)    Resp 17    Ht 5\' 2"  (1.575 m)    Wt 86.2 kg    SpO2 100%    BMI 34.76 kg/m  Physical Exam Vitals and nursing note reviewed.  Constitutional:      General: She is not in acute distress.    Appearance: She is well-developed. She is not ill-appearing or diaphoretic.  HENT:     Head: Normocephalic and atraumatic.  Cardiovascular:     Rate and Rhythm: Normal rate and regular rhythm.     Pulses:          Radial pulses are 2+ on the right side.  Pulmonary:     Effort: Pulmonary effort is normal.  Abdominal:     General: There is no distension.  Musculoskeletal:     Right shoulder: Tenderness (mostly posterior, somewhat in lateral clavicle - no deformities) present. Decreased range of motion (painful, but can range shoulder).     Comments: Normal strength and sensation in RUE  Skin:  General: Skin is warm and dry.  Neurological:     Mental Status: She is alert.    ED Results / Procedures / Treatments   Labs (all labs ordered are listed, but only abnormal results are displayed) Labs Reviewed - No data to display  EKG None  Radiology DG Clavicle Right  Result Date: 03/30/2021 CLINICAL DATA:  Right shoulder pain for several days, no known injury, initial encounter EXAM: RIGHT CLAVICLE - 2+ VIEWS COMPARISON:  None. FINDINGS: There is no evidence of fracture or other focal bone lesions. Soft tissues are unremarkable. IMPRESSION: No acute abnormality in the clavicle is seen. Electronically Signed   By: Alcide Clever M.D.   On: 03/30/2021 20:07   DG Shoulder Right  Result Date: 03/30/2021 CLINICAL DATA:  Shoulder pain for several days, no known injury, initial encounter EXAM: RIGHT SHOULDER - 2+  VIEW COMPARISON:  None. FINDINGS: There is no evidence of fracture or dislocation. There is no evidence of arthropathy or other focal bone abnormality. Soft tissues are unremarkable. IMPRESSION: No acute abnormality noted. Electronically Signed   By: Alcide Clever M.D.   On: 03/30/2021 20:07    Procedures Procedures    Medications Ordered in ED Medications  ibuprofen (ADVIL) tablet 600 mg (600 mg Oral Given 03/30/21 1951)    ED Course/ Medical Decision Making/ A&P                           Medical Decision Making Amount and/or Complexity of Data Reviewed Radiology: ordered.   Patient seems to have a muscular strain or muscular pain of her shoulder/neck region.  No neuro findings on exam.  Vital signs are normal.  Given her degree of discomfort, an x-ray was obtained of her shoulder and clavicle.  I have personally reviewed and interpreted these and see no obvious bony findings.  At this point, I recommended she take some ibuprofen.  We discussed muscle relaxers but she would like to hold off.  There was no significant trauma.  However given her discomfort, we will have her follow-up with sports medicine if not improving by next week.  Given return precautions.        Final Clinical Impression(s) / ED Diagnoses Final diagnoses:  Strain of right shoulder, initial encounter    Rx / DC Orders ED Discharge Orders     None         Pricilla Loveless, MD 03/30/21 2252

## 2021-03-30 NOTE — ED Notes (Signed)
Patient transported to X-ray 

## 2021-05-01 ENCOUNTER — Encounter (HOSPITAL_BASED_OUTPATIENT_CLINIC_OR_DEPARTMENT_OTHER): Payer: Self-pay | Admitting: *Deleted

## 2021-05-01 ENCOUNTER — Other Ambulatory Visit: Payer: Self-pay

## 2021-05-01 ENCOUNTER — Emergency Department (HOSPITAL_BASED_OUTPATIENT_CLINIC_OR_DEPARTMENT_OTHER)
Admission: EM | Admit: 2021-05-01 | Discharge: 2021-05-02 | Disposition: A | Discharge status: Home / Self Care | Payer: Medicaid Other | Attending: Emergency Medicine | Admitting: Emergency Medicine

## 2021-05-01 DIAGNOSIS — S00411A Abrasion of right ear, initial encounter: Secondary | ICD-10-CM | POA: Insufficient documentation

## 2021-05-01 DIAGNOSIS — D72829 Elevated white blood cell count, unspecified: Secondary | ICD-10-CM | POA: Insufficient documentation

## 2021-05-01 DIAGNOSIS — B349 Viral infection, unspecified: Secondary | ICD-10-CM

## 2021-05-01 DIAGNOSIS — Z20822 Contact with and (suspected) exposure to covid-19: Secondary | ICD-10-CM | POA: Insufficient documentation

## 2021-05-01 DIAGNOSIS — E876 Hypokalemia: Secondary | ICD-10-CM | POA: Insufficient documentation

## 2021-05-01 DIAGNOSIS — E871 Hypo-osmolality and hyponatremia: Secondary | ICD-10-CM | POA: Insufficient documentation

## 2021-05-01 DIAGNOSIS — X58XXXA Exposure to other specified factors, initial encounter: Secondary | ICD-10-CM | POA: Insufficient documentation

## 2021-05-01 LAB — URINALYSIS, MICROSCOPIC (REFLEX): RBC / HPF: NONE SEEN RBC/hpf (ref 0–5)

## 2021-05-01 LAB — URINALYSIS, ROUTINE W REFLEX MICROSCOPIC
Bilirubin Urine: NEGATIVE
Glucose, UA: NEGATIVE mg/dL
Hgb urine dipstick: NEGATIVE
Ketones, ur: NEGATIVE mg/dL
Nitrite: NEGATIVE
Protein, ur: NEGATIVE mg/dL
Specific Gravity, Urine: 1.025 (ref 1.005–1.030)
pH: 5.5 (ref 5.0–8.0)

## 2021-05-01 LAB — COMPREHENSIVE METABOLIC PANEL
ALT: 13 U/L (ref 0–44)
AST: 15 U/L (ref 15–41)
Albumin: 3.6 g/dL (ref 3.5–5.0)
Alkaline Phosphatase: 95 U/L (ref 38–126)
Anion gap: 8 (ref 5–15)
BUN: 5 mg/dL — ABNORMAL LOW (ref 6–20)
CO2: 21 mmol/L — ABNORMAL LOW (ref 22–32)
Calcium: 8.9 mg/dL (ref 8.9–10.3)
Chloride: 102 mmol/L (ref 98–111)
Creatinine, Ser: 0.62 mg/dL (ref 0.44–1.00)
GFR, Estimated: >60 mL/min (ref >60)
Glucose, Bld: 103 mg/dL — ABNORMAL HIGH (ref 70–99)
Potassium: 3.3 mmol/L — ABNORMAL LOW (ref 3.5–5.1)
Sodium: 131 mmol/L — ABNORMAL LOW (ref 135–145)
Total Bilirubin: 0.5 mg/dL (ref 0.3–1.2)
Total Protein: 8.2 g/dL — ABNORMAL HIGH (ref 6.5–8.1)

## 2021-05-01 LAB — CBC WITH DIFFERENTIAL/PLATELET
Abs Immature Granulocytes: 0.1 10*3/uL — ABNORMAL HIGH (ref 0.00–0.07)
Basophils Absolute: 0 10*3/uL (ref 0.0–0.1)
Basophils Relative: 0 %
Eosinophils Absolute: 0 10*3/uL (ref 0.0–0.5)
Eosinophils Relative: 0 %
HCT: 40.7 % (ref 36.0–46.0)
Hemoglobin: 14.2 g/dL (ref 12.0–15.0)
Immature Granulocytes: 1 %
Lymphocytes Relative: 8 %
Lymphs Abs: 1.4 10*3/uL (ref 0.7–4.0)
MCH: 29.9 pg (ref 26.0–34.0)
MCHC: 34.9 g/dL (ref 30.0–36.0)
MCV: 85.7 fL (ref 80.0–100.0)
Monocytes Absolute: 1 10*3/uL (ref 0.1–1.0)
Monocytes Relative: 6 %
Neutro Abs: 14.1 10*3/uL — ABNORMAL HIGH (ref 1.7–7.7)
Neutrophils Relative %: 85 %
Platelets: 333 10*3/uL (ref 150–400)
RBC: 4.75 MIL/uL (ref 3.87–5.11)
RDW: 13.2 % (ref 11.5–15.5)
WBC: 16.6 10*3/uL — ABNORMAL HIGH (ref 4.0–10.5)
nRBC: 0 % (ref 0.0–0.2)

## 2021-05-01 LAB — RESP PANEL BY RT-PCR (FLU A&B, COVID) ARPGX2
Influenza A by PCR: NEGATIVE
Influenza B by PCR: NEGATIVE
SARS Coronavirus 2 by RT PCR: NEGATIVE

## 2021-05-01 LAB — GROUP A STREP BY PCR: Group A Strep by PCR: NOT DETECTED

## 2021-05-01 MED ORDER — LACTATED RINGERS IV BOLUS
1000.0000 mL | Freq: Once | INTRAVENOUS | Status: AC
  Administered 2021-05-01: 1000 mL via INTRAVENOUS

## 2021-05-01 MED ORDER — KETOROLAC TROMETHAMINE 30 MG/ML IJ SOLN
30.0000 mg | Freq: Once | INTRAMUSCULAR | Status: AC
  Administered 2021-05-01: 30 mg via INTRAVENOUS
  Filled 2021-05-01: qty 1

## 2021-05-01 MED ORDER — ONDANSETRON 4 MG PO TBDP: 4.0000 mg | ORAL_TABLET | Freq: Three times a day (TID) | ORAL | 0 refills | Status: AC | PRN

## 2021-05-01 MED ORDER — ONDANSETRON HCL 4 MG/2ML IJ SOLN
4.0000 mg | Freq: Once | INTRAMUSCULAR | Status: AC
  Administered 2021-05-01: 4 mg via INTRAVENOUS
  Filled 2021-05-01: qty 2

## 2021-05-01 NOTE — ED Provider Notes (Signed)
[facility] EMERGENCY DEPARTMENT  Provider Note  CSN: [id number] Arrival date & time: 05/01/21 2000  History Chief Complaint  Patient presents with   Fever    Annette Middleton is a 26 y.o. female reports she has been feeling sick for the last 2 days, reports fever yesterday and today, nasal congestion, nausea, vomiting and poor appetite. Has not had anything to eat all day today. Now also having a headache. No abdominal pain, chest pain, cough or SOB.    Home Medications Prior to Admission medications   Medication Sig Start Date End Date Taking? Authorizing Provider  levonorgestrel-ethinyl estradiol (AVIANE,ALESSE,LESSINA) 0.1-20 MG-MCG tablet Take 1 tablet by mouth daily.   Yes [provider]  albuterol (PROVENTIL) (2.5 MG/3ML) 0.083% nebulizer solution Take 2.5 mg by nebulization every 6 (six) hours as needed. Shortness of breath    [provider]  famotidine (PEPCID) 20 MG tablet Take 1 tablet (20 mg total) by mouth 2 (two) times daily. 03/30/15   Palumbo, April, MD  ibuprofen (ADVIL) 600 MG tablet Take 1 tablet (600 mg total) by mouth every 6 (six) hours as needed. 01/05/19   Fawze, Mina A, PA-C  ondansetron (ZOFRAN ODT) 4 MG disintegrating tablet Take 1 tablet (4 mg total) by mouth every 8 (eight) hours as needed. 05/01/21   Pollyann Savoy, MD  predniSONE (DELTASONE) 5 MG tablet Take 6 pills for first day, 5 pills second day, 4 pills third day, 3 pills fourth day, 2 pills the fifth day, and 1 pill sixth day. 03/24/19   Myra Rude, MD  Probiotic CAPS Take 1 capsule by mouth daily. 05/17/18   Ward, Layla Maw, DO  traMADol (ULTRAM) 50 MG tablet Take 1 tablet (50 mg total) by mouth every 6 (six) hours as needed. 03/11/18   Geoffery Lyons, MD     Allergies    Fish-derived products, Red dye, and Sulfa antibiotics   Review of Systems   Review of Systems Please see HPI for pertinent positives and negatives  Physical Exam BP 111/73 (BP Location:  Left Arm)    Pulse 83    Temp 98.5 F (36.9 C)    Resp 18    Ht 5\' 2"  (1.575 m)    Wt 86.2 kg    SpO2 100%    BMI 34.76 kg/m   Physical Exam Vitals and nursing note reviewed.  Constitutional:      Appearance: Normal appearance.  HENT:     Head: Normocephalic and atraumatic.     Right Ear: Tympanic membrane normal.     Left Ear: Tympanic membrane normal.     Ears:     Comments: Abrasion R pinna, she isn't sure where it came from.     Nose: Congestion present.     Mouth/Throat:     Mouth: Mucous membranes are dry.  Eyes:     Extraocular Movements: Extraocular movements intact.     Conjunctiva/sclera: Conjunctivae normal.  Cardiovascular:     Rate and Rhythm: Normal rate.  Pulmonary:     Effort: Pulmonary effort is normal.     Breath sounds: Normal breath sounds.  Abdominal:     General: Abdomen is flat.     Palpations: Abdomen is soft.     Tenderness: There is no abdominal tenderness.  Musculoskeletal:        General: No swelling. Normal range of motion.     Cervical back: Neck supple.  Skin:    General: Skin is warm and dry.  Neurological:     General: No focal deficit present.     Mental Status: She is alert.  Psychiatric:        Mood and Affect: Mood normal.    ED Results / Procedures / Treatments   EKG None  Procedures Procedures  Medications Ordered in the ED Medications  ketorolac (TORADOL) 30 MG/ML injection 30 mg (has no administration in time range)  ondansetron (ZOFRAN) injection 4 mg (4 mg Intravenous Given 05/01/21 2216)  lactated ringers bolus 1,000 mL (1,000 mLs Intravenous New Bag/Given 05/01/21 2227)    Initial Impression and Plan  Patient with viral syndrome symptoms has not been able to eat in 2 days. Had a fever at home, improved with APAP, but was initially tachycardic. Appears dry on exam. Will check labs, give IVF/zofran. Covid/Flu/Strep done in triage all negative.   ED Course   Clinical Course as of 05/01/21 2311  Mon May 01, 2021   2215 UA is neg for infection.  [CS]  2249 CBC with mild leukocytosis, consistent with likely viral syndrome. CMP has mild hyponatremia and hypokalemia, given LR for repletion.  [CS]  2309 Patient feeling better but still having some headache and photophobia after IVF. Will give a dose of toradol for comfort and plan discharge with Rx for Zofran and encouraged to advance diet as tolerated at home.  [CS]    Clinical Course User Index [CS] Pollyann Savoy, MD     MDM Rules/Calculators/A&P Medical Decision Making Given presenting complaint, I considered that admission might be necessary. After review of results from ED lab and/or imaging studies, admission to the hospital is not indicated at this time.    Problems Addressed: Viral syndrome: acute illness or injury  Amount and/or Complexity of Data Reviewed Labs: ordered. Decision-making details documented in ED Course.  Risk Prescription drug management. Decision regarding hospitalization.    Final Clinical Impression(s) / ED Diagnoses Final diagnoses:  Viral syndrome    Rx / DC Orders ED Discharge Orders          Ordered    ondansetron (ZOFRAN ODT) 4 MG disintegrating tablet  Every 8 hours PRN        05/01/21 2310             Pollyann Savoy, MD 05/01/21 2311

## 2021-05-01 NOTE — ED Triage Notes (Signed)
Fever since yesterday. Headache, sore throat. No appetite.

## 2021-10-26 ENCOUNTER — Other Ambulatory Visit: Payer: Self-pay

## 2021-10-26 ENCOUNTER — Emergency Department (HOSPITAL_BASED_OUTPATIENT_CLINIC_OR_DEPARTMENT_OTHER): Payer: Medicaid Other

## 2021-10-26 ENCOUNTER — Emergency Department (HOSPITAL_BASED_OUTPATIENT_CLINIC_OR_DEPARTMENT_OTHER)
Admission: EM | Admit: 2021-10-26 | Discharge: 2021-10-26 | Disposition: A | Discharge status: Home / Self Care | Payer: Medicaid Other | Attending: Emergency Medicine | Admitting: Emergency Medicine

## 2021-10-26 ENCOUNTER — Encounter (HOSPITAL_BASED_OUTPATIENT_CLINIC_OR_DEPARTMENT_OTHER): Payer: Self-pay | Admitting: Emergency Medicine

## 2021-10-26 DIAGNOSIS — Z7951 Long term (current) use of inhaled steroids: Secondary | ICD-10-CM | POA: Insufficient documentation

## 2021-10-26 DIAGNOSIS — M25571 Pain in right ankle and joints of right foot: Secondary | ICD-10-CM | POA: Insufficient documentation

## 2021-10-26 DIAGNOSIS — J45909 Unspecified asthma, uncomplicated: Secondary | ICD-10-CM | POA: Insufficient documentation

## 2021-10-26 NOTE — ED Provider Notes (Signed)
[facility] EMERGENCY DEPARTMENT Provider Note   CSN: [id number] Arrival date & time: 10/26/21  2223     History  Chief Complaint  Patient presents with   Ankle Pain    Annette Middleton is a 26 y.o. female.  Patient presents to the hospital complaining of right ankle pain for the past 5 days.  The patient denies any trauma or injury to the area.  She does endorse being at [employer] last week and walking approximately 35 miles over the week which is far more than she normally walks.  Patient is wearing a cam boot at this time that she brought from home after borrowing from a friend.  She describes the pain as soreness in the ankle region of the right foot.  Patient with past medical history for asthma.   HPI     Home Medications Prior to Admission medications   Medication Sig Start Date End Date Taking? Authorizing Provider  albuterol (PROVENTIL) (2.5 MG/3ML) 0.083% nebulizer solution Take 2.5 mg by nebulization every 6 (six) hours as needed. Shortness of breath    [provider]  famotidine (PEPCID) 20 MG tablet Take 1 tablet (20 mg total) by mouth 2 (two) times daily. 03/30/15   Palumbo, April, MD  ibuprofen (ADVIL) 600 MG tablet Take 1 tablet (600 mg total) by mouth every 6 (six) hours as needed. 01/05/19   Michela Pitcher A, PA-C  levonorgestrel-ethinyl estradiol (AVIANE,ALESSE,LESSINA) 0.1-20 MG-MCG tablet Take 1 tablet by mouth daily.    [provider]  ondansetron (ZOFRAN ODT) 4 MG disintegrating tablet Take 1 tablet (4 mg total) by mouth every 8 (eight) hours as needed. 05/01/21   Pollyann Savoy, MD  predniSONE (DELTASONE) 5 MG tablet Take 6 pills for first day, 5 pills second day, 4 pills third day, 3 pills fourth day, 2 pills the fifth day, and 1 pill sixth day. 03/24/19   Myra Rude, MD  Probiotic CAPS Take 1 capsule by mouth daily. 05/17/18   Ward, Layla Maw, DO  traMADol (ULTRAM) 50 MG tablet Take 1 tablet (50 mg total) by mouth every 6  (six) hours as needed. 03/11/18   Geoffery Lyons, MD      Allergies    Fish-derived products, Red dye, and Sulfa antibiotics    Review of Systems   Review of Systems  Respiratory:  Negative for shortness of breath.   Cardiovascular:  Negative for chest pain.  Gastrointestinal:  Negative for abdominal pain.  Musculoskeletal:  Positive for arthralgias.    Physical Exam Updated Vital Signs BP (!) 128/96 (BP Location: Right Arm)   Pulse 90   Temp 98.7 F (37.1 C) (Oral)   Resp 18   Ht 5\' 2"  (1.575 m)   Wt 86.2 kg   LMP 09/27/2021   SpO2 100%   BMI 34.75 kg/m  Physical Exam Vitals and nursing note reviewed.  Constitutional:      General: She is not in acute distress. HENT:     Head: Normocephalic and atraumatic.     Mouth/Throat:     Mouth: Mucous membranes are moist.  Eyes:     Conjunctiva/sclera: Conjunctivae normal.  Cardiovascular:     Rate and Rhythm: Normal rate and regular rhythm.     Pulses: Normal pulses.  Pulmonary:     Effort: Pulmonary effort is normal.     Breath sounds: Normal breath sounds.  Musculoskeletal:        General: Tenderness (Mild tenderness to palpation of the medial  malleolus on the right side and dorsal portion of right foot) present. No swelling or signs of injury. Normal range of motion.     Cervical back: Neck supple.  Skin:    General: Skin is warm and dry.     Capillary Refill: Capillary refill takes less than 2 seconds.  Neurological:     Mental Status: She is alert and oriented to person, place, and time.     ED Results / Procedures / Treatments   Labs (all labs ordered are listed, but only abnormal results are displayed) Labs Reviewed - No data to display  EKG None  Radiology DG Ankle Complete Right  Result Date: 10/26/2021 CLINICAL DATA:  Right ankle pain EXAM: RIGHT ANKLE - COMPLETE 3+ VIEW COMPARISON:  Radiographs 08/18/2018 FINDINGS: There is no evidence of fracture, dislocation, or joint effusion. There is no evidence  of arthropathy or other focal bone abnormality. Soft tissues are unremarkable. IMPRESSION: Negative. Electronically Signed   By: Minerva Fester M.D.   On: 10/26/2021 22:52    Procedures Procedures    Medications Ordered in ED Medications - No data to display  ED Course/ Medical Decision Making/ A&P                           Medical Decision Making Amount and/or Complexity of Data Reviewed Radiology: ordered.   The patient presents today complaining of right-sided ankle pain.  Differential includes but is not limited to fracture, dislocation, sprain, and others  I ordered and interpreted imaging including plain films of the right ankle.  No fracture or dislocation was noted.  I agree with radiologist findings.  The patient has no known trauma.  There is no reason to consider sprain at this time.  No fracture or dislocation noted on imaging and no physical evidence of same.  The patient may be having pain just due to overuse.  The patient is going to take anti-inflammatory medicine and Tylenol as needed.  I discussed a prescription but the patient states she has naproxen at home that she will take.  Orthopedic follow-up information provided in case the patient continues to have pain.  Discharge home.        Final Clinical Impression(s) / ED Diagnoses Final diagnoses:  Acute right ankle pain    Rx / DC Orders ED Discharge Orders     None         Pamala Duffel 10/26/21 2342    Melene Plan, DO 10/27/21 1457

## 2021-10-26 NOTE — Discharge Instructions (Addendum)
You were seen today for ankle pain. Imaging shows no evidence of a fracture.  Please take naproxen/Aleve as directed on the bottle and Tylenol as needed.  I recommend elevation of the affected extremity and ice.  Please follow-up with orthopedics as needed.  I provided the contact information for the provider who is on call today

## 2021-10-26 NOTE — ED Triage Notes (Signed)
  Patient comes in with R ankle pain that started around 5 days ago.  No known injury but patient states she was at [facility] last week and walked about 35 miles.  Concerned for possible stress fracture.  Patient wearing cam boot from home.  Pain 7/10, sore/sharp.

## 2022-06-25 ENCOUNTER — Encounter: Payer: Self-pay | Admitting: *Deleted

## 2022-09-25 ENCOUNTER — Emergency Department (HOSPITAL_BASED_OUTPATIENT_CLINIC_OR_DEPARTMENT_OTHER): Payer: Medicaid Other

## 2022-09-25 ENCOUNTER — Emergency Department (HOSPITAL_BASED_OUTPATIENT_CLINIC_OR_DEPARTMENT_OTHER)
Admission: EM | Admit: 2022-09-25 | Discharge: 2022-09-25 | Disposition: A | Discharge status: Home / Self Care | Payer: Medicaid Other | Attending: Emergency Medicine | Admitting: Emergency Medicine

## 2022-09-25 ENCOUNTER — Other Ambulatory Visit: Payer: Self-pay

## 2022-09-25 ENCOUNTER — Encounter (HOSPITAL_BASED_OUTPATIENT_CLINIC_OR_DEPARTMENT_OTHER): Payer: Self-pay | Admitting: Pediatrics

## 2022-09-25 DIAGNOSIS — M25531 Pain in right wrist: Secondary | ICD-10-CM | POA: Insufficient documentation

## 2022-09-25 DIAGNOSIS — X500XXA Overexertion from strenuous movement or load, initial encounter: Secondary | ICD-10-CM | POA: Insufficient documentation

## 2022-09-25 MED ORDER — NAPROXEN 375 MG PO TABS
375.0000 mg | ORAL_TABLET | Freq: Two times a day (BID) | ORAL | 0 refills | Status: AC
Start: 2022-09-25 — End: ?

## 2022-09-25 MED ORDER — KETOROLAC TROMETHAMINE 15 MG/ML IJ SOLN
15.0000 mg | Freq: Once | INTRAMUSCULAR | Status: AC
Start: 2022-09-25 — End: 2022-09-25
  Administered 2022-09-25: 15 mg via INTRAMUSCULAR
  Filled 2022-09-25: qty 1

## 2022-09-25 NOTE — ED Provider Notes (Signed)
[place] EMERGENCY DEPARTMENT AT [facility] Provider Note   CSN: [id number] Arrival date & time: 09/25/22  1337     History  Chief Complaint  Patient presents with   Wrist Pain    Annette Middleton is a 27 y.o. female.  27 year old female presents today for concern of right wrist pain.  She states she helped her boyfriend move over the weekend including heavy furniture.  Pain across the wrist joint.  Pain with certain range of motion.  No fever or swelling.  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  She is right-hand dominant.  The history is provided by the patient. No language interpreter was used.       Home Medications Prior to Admission medications   Medication Sig Start Date End Date Taking? Authorizing Provider  albuterol (PROVENTIL) (2.5 MG/3ML) 0.083% nebulizer solution Take 2.5 mg by nebulization every 6 (six) hours as needed. Shortness of breath    [provider]  famotidine (PEPCID) 20 MG tablet Take 1 tablet (20 mg total) by mouth 2 (two) times daily. 03/30/15   Palumbo, April, MD  ibuprofen (ADVIL) 600 MG tablet Take 1 tablet (600 mg total) by mouth every 6 (six) hours as needed. 01/05/19   Michela Pitcher A, PA-C  levonorgestrel-ethinyl estradiol (AVIANE,ALESSE,LESSINA) 0.1-20 MG-MCG tablet Take 1 tablet by mouth daily.    [provider]  ondansetron (ZOFRAN ODT) 4 MG disintegrating tablet Take 1 tablet (4 mg total) by mouth every 8 (eight) hours as needed. 05/01/21   Pollyann Savoy, MD  predniSONE (DELTASONE) 5 MG tablet Take 6 pills for first day, 5 pills second day, 4 pills third day, 3 pills fourth day, 2 pills the fifth day, and 1 pill sixth day. 03/24/19   Myra Rude, MD  Probiotic CAPS Take 1 capsule by mouth daily. 05/17/18   Ward, Layla Maw, DO  traMADol (ULTRAM) 50 MG tablet Take 1 tablet (50 mg total) by mouth every 6 (six) hours as needed. 03/11/18    Geoffery Lyons, MD      Allergies    Fish-derived products, Red dye, and Sulfa antibiotics    Review of Systems   Review of Systems  Constitutional:  Negative for fever.  Musculoskeletal:  Positive for arthralgias. Negative for joint swelling.  All other systems reviewed and are negative.   Physical Exam Updated Vital Signs BP 124/80 (BP Location: Right Arm)   Pulse 86   Temp 99 F (37.2 C) (Oral)   Resp 16   Ht 5\' 2"  (1.575 m)   Wt 84.2 kg   LMP 09/18/2022 (Approximate)   SpO2 98%   BMI 33.96 kg/m  Physical Exam Vitals and nursing note reviewed.  Constitutional:      General: She is not in acute distress.    Appearance: Normal appearance. She is not ill-appearing.  HENT:     Head: Normocephalic and atraumatic.     Nose: Nose normal.  Eyes:     Conjunctiva/sclera: Conjunctivae normal.  Pulmonary:     Effort: Pulmonary effort is normal. No respiratory distress.  Musculoskeletal:        General: No deformity. Normal range of motion.     Comments: Right wrist without swelling, deformity.  Tenderness palpation present across the wrist joint.  No anatomical snuffbox tenderness.  Pain with end range of motion as well particularly medial and lateral flexion.  Neurovascular intact.  Right elbow with full range of motion without tenderness palpation.  Compartments in the right forearm soft.  Skin:    Findings: No rash.  Neurological:     Mental Status: She is alert.     ED Results / Procedures / Treatments   Labs (all labs ordered are listed, but only abnormal results are displayed) Labs Reviewed - No data to display  EKG None  Radiology DG Wrist Complete Right  Result Date: 09/25/2022 CLINICAL DATA:  Pain EXAM: RIGHT WRIST - COMPLETE 3+ VIEW COMPARISON:  None Available. FINDINGS: There is no evidence of fracture or dislocation. There is no evidence of arthropathy or other focal bone abnormality. Soft tissues are unremarkable. IMPRESSION: No radiographic abnormalities  are seen in right wrist. Electronically Signed   By: Ernie Avena M.D.   On: 09/25/2022 14:52    Procedures Procedures    Medications Ordered in ED Medications - No data to display  ED Course/ Medical Decision Making/ A&P                             Medical Decision Making Amount and/or Complexity of Data Reviewed Radiology: ordered.   27 year old female presents with right wrist pain.  X-ray without any acute bony process.  No joint swelling.  No erythema or warmth.  Afebrile.  No suspicion for septic joint.  She did heavy lifting over the weekend to help her boyfriend move.  Will provide anti-inflammatory, orthopedic referral, and a work note for tonight.  Wrist brace offered.  Patient voices understanding and is in agreement with plan.  She is appropriate for discharge.   Final Clinical Impression(s) / ED Diagnoses Final diagnoses:  Right wrist pain    Rx / DC Orders ED Discharge Orders          Ordered    naproxen (NAPROSYN) 375 MG tablet  2 times daily        09/25/22 1508              Marita Kansas, PA-C 09/25/22 1515    Ernie Avena, MD 09/25/22 1540

## 2022-09-25 NOTE — Discharge Instructions (Addendum)
X-ray without concerning findings.  Exam overall reassuring.  Naproxen prescribed.  Sports medicine referral given if you have persistent symptoms.

## 2022-09-25 NOTE — ED Triage Notes (Signed)
C/O right wrist pain started yesterday; denies any recent injury but have been doing a lot with moving.
